# Patient Record
Sex: Male | Born: 1955 | Race: White | Hispanic: No | Marital: Married | Smoking: Former smoker
Health system: Southern US, Community
[De-identification: ages and names within clinical notes are randomized; demographics above are authoritative.]

## PROBLEM LIST (undated history)

## (undated) DIAGNOSIS — M1990 Unspecified osteoarthritis, unspecified site: Secondary | ICD-10-CM

## (undated) DIAGNOSIS — K219 Gastro-esophageal reflux disease without esophagitis: Secondary | ICD-10-CM

## (undated) DIAGNOSIS — S46219A Strain of muscle, fascia and tendon of other parts of biceps, unspecified arm, initial encounter: Secondary | ICD-10-CM

## (undated) DIAGNOSIS — J069 Acute upper respiratory infection, unspecified: Secondary | ICD-10-CM

## (undated) DIAGNOSIS — M75122 Complete rotator cuff tear or rupture of left shoulder, not specified as traumatic: Secondary | ICD-10-CM

## (undated) DIAGNOSIS — Z8719 Personal history of other diseases of the digestive system: Secondary | ICD-10-CM

## (undated) DIAGNOSIS — M75102 Unspecified rotator cuff tear or rupture of left shoulder, not specified as traumatic: Secondary | ICD-10-CM

## (undated) DIAGNOSIS — F17200 Nicotine dependence, unspecified, uncomplicated: Secondary | ICD-10-CM

## (undated) DIAGNOSIS — J449 Chronic obstructive pulmonary disease, unspecified: Secondary | ICD-10-CM

## (undated) DIAGNOSIS — R0602 Shortness of breath: Secondary | ICD-10-CM

## (undated) DIAGNOSIS — M549 Dorsalgia, unspecified: Secondary | ICD-10-CM

## (undated) HISTORY — PX: ROTATOR CUFF REPAIR: SHX139

## (undated) HISTORY — PX: TOTAL SHOULDER ARTHROPLASTY: SHX126

## (undated) HISTORY — PX: HERNIA REPAIR: SHX51

---

## 1998-03-13 ENCOUNTER — Encounter: Payer: Self-pay | Admitting: Emergency Medicine

## 1998-03-13 ENCOUNTER — Emergency Department (HOSPITAL_COMMUNITY): Admission: EM | Admit: 1998-03-13 | Discharge: 1998-03-13 | Payer: Self-pay | Admitting: Emergency Medicine

## 2006-06-20 HISTORY — PX: TOTAL SHOULDER ARTHROPLASTY: SHX126

## 2006-06-20 HISTORY — PX: ROTATOR CUFF REPAIR: SHX139

## 2006-09-21 ENCOUNTER — Ambulatory Visit (HOSPITAL_BASED_OUTPATIENT_CLINIC_OR_DEPARTMENT_OTHER): Admission: RE | Admit: 2006-09-21 | Discharge: 2006-09-22 | Payer: Self-pay | Admitting: Orthopedic Surgery

## 2007-03-13 ENCOUNTER — Inpatient Hospital Stay (HOSPITAL_COMMUNITY): Admission: RE | Admit: 2007-03-13 | Discharge: 2007-03-15 | Payer: Self-pay | Admitting: Orthopedic Surgery

## 2010-11-02 NOTE — Op Note (Signed)
Daniel Monroe, Daniel Monroe                ACCOUNT NO.:  [id number]   MEDICAL RECORD NO.:  [id number]          PATIENT TYPE:  INP   LOCATION:  5015                         FACILITY:  [facility]   PHYSICIAN:  Katy Fitch. Sypher, M.D. DATE OF BIRTH:  Apr 29, 1956   DATE OF PROCEDURE:  03/13/2007  DATE OF DISCHARGE:                               OPERATIVE REPORT   PREOPERATIVE DIAGNOSIS:  Cuff tear arthropathy, right shoulder, due to  an approximately 42+-year-old rotator cuff avulsion involving the entire  supraspinatus, infraspinatus, and teres minor tendons with a  degenerative subscapularis tendon and evidence of chronic greater  tuberosity subacromial impingement due to proximal migration of humeral  head.   POSTOPERATIVE DIAGNOSIS:  Cuff tear arthropathy, right shoulder, due to  an approximately 38+-year-old rotator cuff avulsion involving the entire  supraspinatus, infraspinatus, and teres minor tendons with a  degenerative subscapularis tendon and evidence of chronic greater  tuberosity subacromial impingement due to proximal migration of humeral  head, with confirmation of extensive tendinopathy of subscapularis  tendon and complete loss of hyaline articular cartilage on the dorsal  surface of the humeral head with development of a marginal osteophyte  and grade 3 chondromalacia of articular surface of central humeral head.   OPERATION:  1. Right shoulder hemiarthroplasty utilizing a Bi[employer] Bio-Modular 10 x      150-mm humeral stem component and a 54 x 27-mm head and neck      component.  2. Reconstruction of rotator cuff and bicipital tendon interval with      superior and lateral transfer of subscapularis for coverage of the      long head of the biceps.   SURGEON:  Katy Fitch. Sypher, M.D.   ASSISTANT:  Annye Rusk, P.A.-C.   ANESTHESIA:  General endotracheal supplemented by a right interscalene  block.   SUPERVISING ANESTHESIOLOGIST:  Sheldon Silvan, M.D.   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   He presented at that time with a history of severe right shoulder pain  with weakness of abduction and external rotation and moderately severe  left shoulder pain.   Clinical examination revealed evidence of AC arthropathy and weakness of  abduction and external rotation on the right and moderate weakness of  abduction and external rotation on the left.  Plain films documented  extensive AC arthropathy bilaterally and remodeling of the acromion due  to a high-riding humeral head on the right.  Bilateral shoulder MRIs  were obtained which documented a chronic retracted rotator cuff avulsion  of the supraspinatus, infraspinatus, teres minor tendons on the right  with a degenerative subscapularis that was still attached to the greater  tuberosity.   There was evidence of early glenohumeral arthritis with a marginal  osteophyte forming and irregularity at the greater tuberosity due to  subacromial impingement.   On the left, there was a rotator cuff tear with moderate retraction and  unfavorable AC anatomy.   After lengthy informed consent with Daniel Monroe, we elected to proceed  with repair of the left rotator  cuff as our initial procedure to allow  him to gain maximum function prior to attempted reconstruction of the  right shoulder.   Preoperatively, he was advised that based on the MRI appearance of his  rotator cuff muscles that were avulsed with fatty substitution and loss  of volume, he did not have what appeared to be a repairable rotator cuff  tear predicament on the right.   Preoperatively, he noted pain any time he would sleep on the right side,  pain with elevation of his arm above the head, weakness of abduction,  and pain with everyday use of the arm.   We advised him that we could salvage his right shoulder function with a  hemiarthroplasty, considering either a standard or  extended surface area  head implant depending on his bony anatomy.   He understood that the goal of this procedure was pain relief.  We could  not provide him any assurances as to increased strength or  improved  range of motion with this intervention.   After lengthy informed consent and 6 months of consideration, [name] now presents for reconstruction of the right shoulder,  anticipating a right shoulder hemiarthroplasty for cuff tear arthropathy  and reconstruction of the rotator cuff as our findings allow.  Preoperatively, he was advised of the potential risks and benefits of  surgery.  He understands the rehabilitation experience after his surgery  on the left side.   Questions were invited and answered in the holding area.   PROCEDURE:  Daniel Monroe is brought to the operating room and placed in  the supine position on the operating table.   Dr. Ivin Booty had provided an anesthesia consult in the holding area during  which Daniel Monroe agreed to a right interscalene block preoperatively.   This was placed by Dr. Ivin Booty without complication.   He was subsequently transferred to room #1, placed in the supine  position on the operating table, and under Dr. Ivin Booty' direct  supervision, general endotracheal anesthesia induced.  He was carefully  positioned in the beach chair position with the aid of torso and head  holder designed for shoulder arthroscopy.   1 gram of Ancef had been administered in the holding area as an IV  prophylactic antibiotic followed by prep of the right arm with DuraPrep  including the forequarter.  Impervious arthroscopy drapes were applied.   The procedure commenced with examination of the shoulder under  anesthesia.  Daniel Monroe had combined elevation to 180 degrees, external  rotation to 90, internal rotation 80.  He had obvious superior inferior  instability due to the complete avulsion of his cuff.  He also had some  posterior instability due to  the avulsion of his posterior cuff and  posterior capsular laxity.   We subsequently proceeded to plan an incision extending from the  coracoid to the axillary fold.   The incision was taken sharply followed by identification of the  cephalic vein in the deltopectoral interval.  This was mobilized  medially with suture ligation of feeding vessels to the deltoid.  The  clavipectoral fascia was subsequently identified and released followed  by identification of the short head of the biceps and tenotomy of the  lateral half of the short head tendon to allow retraction of the short  head and exposure of the subscapularis.  The anatomy of the  subscapularis was identified.  There was a degenerative knot superiorly  just anterior and inferior to the bicipital groove.  There was only  bursa covering the humeral head that was a millimeter or two thickness.  The entire supraspinatus, infraspinatus, and teres minor tendons had  avulsed and retracted medially.  There was a considerable amount of  degenerative tendon material in the subacromial space.   The biceps tendon was inspected and found to have about a 20%  degenerative tear at the entrance to the bicipital groove but was  otherwise intact.   Given the circumstance of an intact long head of biceps, it is likely  that he would have a better outcome with a standard hemiarthroplasty  rather than the extended surface area hemiarthroplasty so as to lessen  possibility of ruptured long head of the biceps following implantation.   The pectoralis major tendon was released in his upper 15 mm, and the  shoulder was externally rotated.  The subscapularis was gently elevated  with a 15 scalpel blade off the lesser tuberosity.  The inferior capsule  was identified, the axillary nerve palpated, and a joker placed within  the joint capsule.  With extreme care, a small osteotome was used to  elevate the anterior capsule off of the neck of the humerus,  exposing  the inferior aspect of the humeral head.  There was a sizable marginal  osteophyte forming which made identification of the capsular margins  somewhat challenging.   The marginal osteophyte was meticulous removed with a large rongeur  followed by placement of a Crego elevator to protect the axillary nerve.  The bursa was mobilized superiorly followed by placement of a second  curved Crego retractor posteriorly.  The humeral head was then  delivered.  A cutting guide was used to select the proper angle to  resect the humeral head.  The arm was placed in 35 degrees of external  rotation, creating a retroverted cut of 35 degrees.  An oscillating saw  was used to amputate the humeral head followed by use of a rongeur to  complete the resection.   We delivered the humeral head and sized the head.  Initially, a 48 x 24-  mm head appeared satisfactory; however, given the concern about possible  protection of the long head of the biceps as well as the possible use of  an extended surface area head, I ultimately elected to oversize the  heads so that we would have adequate articulation of metal against the  acromion.   Ultimately, the shaft of the humerus was delivered and prepared with  reamers up to an internal diameter of 11 mm.  We were experiencing  considerable chatter on the isthmus at 11 mm of reaming.  Therefore, we  ultimately felt that a 10-mm porous coated stem would be appropriate  choice.   A broach was then used to cut the proximal humerus and trial humeral  heads applied.  The 11-mm broach was simply too large in the proximal  humerus to allow proper seating; therefore, we returned a 10-mm broach  and trial stem.  Various heads were tried, ultimately deciding that the  54 x 27-mm standard hemiarthroplasty head gave the greatest coverage and  most congruous fit with the greater tuberosity.  The extended surface  area would have left the flange over which the long  head of the biceps  would ride, leading likely to a rupture.   In my judgment, protecting the long head of the biceps was an important  strategy.   The intermedullary canal was prepped with irrigation followed by  placement of  cancellus bone graft from the humeral head into the shaft  intermaxillary canal to increase fixation of the porous coated stem.  This was tamped into position with the cutting broach followed by  placement of the 10-mm stem with no-touch technique and tamping it into  place.   Nonabsorbable Dacron sutures were placed through drill holes for  reconstruction of the subscapularis.   In order to protect the long head of the biceps and to encourage an  inferior translation of the humeral head during abduction, I  advanced  the subscapularis approximately 2 cm superior and laterally.  This was  advanced while maintaining the arm in a position of slight internal  rotation with a series of three mattress sutures, repairing the capsule  and subscapularis anatomically, ultimately covering the long head of the  biceps at the bicipital groove.   The remnants of bursa posteriorly were then repaired to this transferred  subscapularis with a running suture of 0 Vicryl.   A very smooth repair was achieved that was congruous with the native  greater tuberosity.   The shoulder was ranged with forward flexion to 170 degrees of elevation  without obvious impingement and was able to externally rotate at least  45 degrees following subscapularis repair.   Hemostasis was achieved followed by thorough irrigation of the wound.  The short head of biceps tenotomy was repaired with grasping suture of  #2 FiberWire followed by placement of a Hemovac drain in the  deltopectoral interval.  The skin was repaired with subdermal sutures of  0 Vicryl, intradermal 3-0 Prolene, and Steri-Strips.  There were no  apparent complications.   Daniel Monroe tolerated surgery and anesthesia well.   His pulse was  approximately 60-70 throughout the procedure.  Our estimated blood loss  was 300 mL.  There were no apparent complications.      Katy Fitch Sypher, M.D.  Electronically Signed     RVS/MEDQ  D:  03/13/2007  T:  03/13/2007  Job:  [id number]

## 2010-11-05 NOTE — Op Note (Signed)
NAMETHEODOROS, Daniel Monroe                ACCOUNT NO.:  [id number]   MEDICAL RECORD NO.:  [id number]          PATIENT TYPE:  AMB   LOCATION:  DSC                          FACILITY:  [facility]   PHYSICIAN:  Katy Fitch. Sypher, M.D. DATE OF BIRTH:  27-Sep-1955   DATE OF PROCEDURE:  09/21/2006  DATE OF DISCHARGE:                               OPERATIVE REPORT   PREOPERATIVE DIAGNOSIS:  Chronic left shoulder pain due to  acromioclavicular joint arthropathy and clinical and MRI-proven chronic  retracted rotator cuff tear involving the supraspinatus and  infraspinatus tendons, with rupture of the long head of the biceps.   POSTOPERATIVE DIAGNOSES:  1. Chronic adhesive capsulitis.  2. Severely degenerative rotator cuff retracted tear.  3. Rupture of long head of biceps.  4. 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   OPERATION:  1. Examination of the left shoulder under anesthesia.  2. Diagnostic arthroscopy of the left glenohumeral joint, followed by      arthroscopic debridement of labral tear and extensive adhesive      capsulitis of granulation tissues with documentation of rupture of      long head of biceps.  3. Subacromial decompression with release of coracoacromial ligament,      acromioplasty, and removal of medial osteophyte at      acromioclavicular joint.  4. Open resection of distal clavicle.  5. Reconstruction of chronic degenerative rotator cuff tear with      lateral advancement with-through bone McLaughlin suture and      biocorkscrew anchors creating a medial footprint.   OPERATING SURGEON:  Katy Fitch. Sypher, M.D.   ASSISTANT:  Molly Maduro Dasnoit PA-C.   ANESTHESIA:  General endotracheal supplemented by a left interscalene  block, supervising anesthesiologist is Dr. Sampson Goon.   INDICATIONS:  Daniel Monroe is a 55 year old right-hand dominant  gentleman referred through the courtesy of a common friend and patient,  Daniel Monroe, for evaluation and  management of bilateral severe  shoulder pain and weakness.  Daniel Monroe was evaluated as an outpatient  and found to have evidence of AC arthropathy bilaterally, a high-riding  humeral head on the right, evidence of a chronic rotator cuff tear, and  impingement signs on the left.  He was sent for bilateral shoulder MRIs,  which documented a very large retracted, probably irreparable, chronic  rotator cuff tear dating more than 55 years of age on the right and on  the left a chronic retracted degenerative rotator cuff tear involving  the supraspinatus and infraspinatus tendons that appeared to be  amendable to possible reconstruction.   Preoperatively, I advised him to consider proceeding with prompt repair  of the left side in an effort to salvage the left shoulder.  Given the  loss of volume in the supraspinatus and the degree of retraction of his  supraspinatus on the right, in my judgment it is unlikely that he will  be able to experience a successful rotator cuff reconstruction although  a partial repair may be possible.   We advised him to proceed with reconstruction of his left rotator cuff  at this time  as salvage appeared possible, and we recommended that he  consider attempted salvage versus hemiarthroplasty of the right shoulder  with a primary indication of pain relief, secondary indication recovery  of motion.   After a lengthy informed consent, Daniel Monroe was brought to the  operating room at this time.  Preoperatively he was interviewed by Dr.  Sampson Goon for an anesthesia consultation.  Dr. Sampson Goon recommended  general endotracheal anesthesia supplemented by interscalene block for  perioperative comfort.   After informed consent, Dr. Sampson Goon placed a left interscalene block  with excellent anesthesia of the left arm and forequarter.   Daniel Monroe was then transferred to room 8. placed in the supine  position on the operating table. and under Dr. Jarrett Ables  direct  supervision general endotracheal anesthesia induced.   Daniel Monroe was placed in the beach-chair position and examination of  the left shoulder under anesthesia revealed elevation 180, external  rotation 90, internal rotation of 90.  He did not show clinical signs of  adhesive capsulitis, although there was considerable crepitation beneath  the acromion.   After routine DuraPrep of the left arm and forequarter, impervious  arthroscopy drapes were applied.  The scope was then introduced through  a standard posterior viewing portal through the soft spot with a blunt  trocar, followed by diagnostic arthroscopy.   Abundant adhesive capsulitis granulation tissue was present within the  joint as well as extensive degeneration of the labrum and a complete  rupture of long head of the biceps.   The pathology was documented with the digital camera, followed by  introduction of a shaver through an anterior portal, followed by  extensive debridement of the deep surface fragments of the rotator cuff  degenerative tear, debridement of labrum and debridement of the  capsulitis granulations.   We then removed the scope and proceeded directly to open reconstruction  of the rotator cuff.   A 7 cm incision was fashioned from the distal clavicle across the  anterior acromion.  Full-thickness flaps were elevated off the acromion,  including the coracoacromial ligament.  A significant portion of the  coracoacromial ligament had ossified and was meticulously dissected out  of the soft tissues.  The acromion had a very significant type 3  anterior lip, which was leveled to a type 1 morphology with an  oscillating saw and hand rasp.  A large osteophyte of the medial AC  joint was removed with rongeur dissection.  The distal 15 mm of clavicle  was removed with an oscillating saw, followed by smoothing of the inferior surface of the distal clavicle with a rongeur.  The bursa was  extremely  hypertrophic and quite scarred.  A thorough bursectomy was  accomplished visualizing a retracted degenerative cuff tear.  There was  a large triangular defect extending back just short of the glenoid.  The  entire supraspinatus and infraspinatus were undermined with shreds of  tendon hanging within the joint.  A proximal longitudinal incision was  fashioned and flaps of the infraspinatus and supraspinatus were raised.  The deep surface remnants of the degenerative tendon were debrided,  followed by decorticating the greater tuberosity over a distance of  approximately 5 cm from anterior to posterior with a power bur.   A gathering baseball stitch of #2 FiberWire was used to converge the  tendon, advance it laterally, and bone tunnels were created to replace  the supraspinatus and infraspinatus anatomically.  A single biocorkscrew  anchor was placed at the joint-hyaline articular  cartilage margin at the  center of the infraspinatus and the center of the supraspinatus to  create a good medial footprint.  After the [facility] suture was  tensioned appropriately, the mattress sutures x4 were inset creating an  excellent medial footprint of the repair.   The cuff repair was then finished with a figure-of-eight suture at the  corner of 0 Vicryl.   The subdeltoid and subacromial space were thoroughly lavaged with the  arthroscopic cannula using the pump for power irrigation, followed by  anatomic repair of the anterior third of the deltoid, initially closing  the dead space by repair to the trapezium at the site of the distal  clavicle resection, followed by repair to the anterior acromion with  periosteal and capsular sutures creating a very strong flap to flap  repair.  The lateral deltoid muscle split was repaired with simple  suture of 0 Vicryl.   The skin was then repaired with subdermal suture of 2-0 Vicryl and  intradermal 3-0 Prolene with Steri-Strips.   A virtually anatomic repair  was achieved with about an 80% thickness of  the rotator cuff.  This should allow Daniel Monroe to recover improved  strength and comfort following decompression.   Daniel Monroe was placed in a compressive dressing and a sling and  transferred to the recovery room with stable signs.   He will be admitted to the recovery care center for observation of his  vital signs and antibiotics in the form of Ancef 1 g IV q.8h. x3 doses  as well as p.o. and IV Dilaudid and IV and PCA morphine as an analgesic  regimen.      Katy Fitch Sypher, M.D.  Electronically Signed     RVS/MEDQ  D:  09/21/2006  T:  09/21/2006  Job:  [id number]

## 2011-03-31 LAB — BASIC METABOLIC PANEL
BUN: 6
BUN: 8
CO2: 28
CO2: 29
Calcium: 7.7 — ABNORMAL LOW
Calcium: 9
Chloride: 101
Chloride: 98
Creatinine, Ser: 0.7
Creatinine, Ser: 0.75
GFR calc Af Amer: >60
GFR calc Af Amer: >60
GFR calc non Af Amer: >60
GFR calc non Af Amer: >60
Glucose, Bld: 116 — ABNORMAL HIGH
Glucose, Bld: 88
Potassium: 3.7
Potassium: 3.8
Sodium: 132 — ABNORMAL LOW
Sodium: 137

## 2011-03-31 LAB — CBC
HCT: 35.9 — ABNORMAL LOW
HCT: 43.5
Hemoglobin: 12.3 — ABNORMAL LOW
Hemoglobin: 15.3
MCHC: 34.3
MCHC: 35.1
MCV: 93.1
MCV: 95.7
Platelets: 348
Platelets: 443 — ABNORMAL HIGH
RBC: 3.75 — ABNORMAL LOW
RBC: 4.67
RDW: 13.6
RDW: 13.6
WBC: 11.2 — ABNORMAL HIGH
WBC: 9.4

## 2012-11-20 ENCOUNTER — Other Ambulatory Visit: Payer: Self-pay | Admitting: Neurosurgery

## 2012-11-20 DIAGNOSIS — M5416 Radiculopathy, lumbar region: Secondary | ICD-10-CM

## 2012-11-27 ENCOUNTER — Ambulatory Visit
Admission: RE | Admit: 2012-11-27 | Discharge: 2012-11-27 | Disposition: A | Discharge status: Home / Self Care | Source: Ambulatory Visit | Attending: Neurosurgery | Admitting: Neurosurgery

## 2012-11-27 DIAGNOSIS — M5416 Radiculopathy, lumbar region: Secondary | ICD-10-CM

## 2012-12-18 ENCOUNTER — Other Ambulatory Visit: Payer: Self-pay | Admitting: Neurosurgery

## 2013-01-01 ENCOUNTER — Encounter (HOSPITAL_COMMUNITY): Payer: Self-pay | Admitting: Pharmacy Technician

## 2013-01-02 NOTE — Pre-Procedure Instructions (Signed)
Daniel Monroe  01/02/2013   Your procedure is scheduled on: Monday, January 07, 2013  Report to [facility] Stay [facility] at 8:45 AM.  Call this number if you have problems the morning of surgery: [phone]   Remember:   Do not eat food or drink liquids after midnight.   Take these medicines the morning of surgery with A SIP OF WATER: gabapentin (NEURONTIN) 300 MG capsule, Tetrahydrozoline HCl (VISINE) if needed: HYDROmorphone (DILAUDID) 4 MG tablet for pain if needed Stop taking Aspirin and herbal medication 7 days prior to surgerys . Do not take any NSAIDs ie: Ibuprofen, Advil, Naproxen or any medication containing Aspirin, (etodolac (LODINE) 400 MG tablet)     Do not wear jewelry, make-up or nail polish.  Do not wear lotions, powders, or perfumes. You may wear deodorant.  Men may shave face and neck.  Do not bring valuables to the hospital.  [facility] is not responsible  for any belongings or valuables.  Contacts, dentures or bridgework may not be worn into surgery.  Leave suitcase in the car. After surgery it may be brought to your room.  For patients admitted to the hospital, checkout time is 11:00 AM the day of discharge.   Patients discharged the day of surgery will not be allowed to drive home.  Name and phone number of your driver:   Special Instructions: Shower using CHG 2 nights before surgery and the night before surgery.  If you shower the day of surgery use CHG.  Use special wash - you have one bottle of CHG for all showers.  You should use approximately 1/3 of the bottle for each shower.   Please read over the following fact sheets that you were given: Pain Booklet, Coughing and Deep Breathing, MRSA Information and Surgical Site Infection Prevention

## 2013-01-03 ENCOUNTER — Encounter (HOSPITAL_COMMUNITY): Payer: Self-pay

## 2013-01-03 ENCOUNTER — Encounter (HOSPITAL_COMMUNITY)
Admission: RE | Admit: 2013-01-03 | Discharge: 2013-01-03 | Disposition: A | Discharge status: Home / Self Care | Source: Ambulatory Visit | Attending: Neurosurgery | Admitting: Neurosurgery

## 2013-01-03 DIAGNOSIS — Z01812 Encounter for preprocedural laboratory examination: Secondary | ICD-10-CM | POA: Insufficient documentation

## 2013-01-03 DIAGNOSIS — M79609 Pain in unspecified limb: Secondary | ICD-10-CM | POA: Insufficient documentation

## 2013-01-03 DIAGNOSIS — Z01818 Encounter for other preprocedural examination: Secondary | ICD-10-CM | POA: Insufficient documentation

## 2013-01-03 DIAGNOSIS — M5106 Intervertebral disc disorders with myelopathy, lumbar region: Secondary | ICD-10-CM | POA: Insufficient documentation

## 2013-01-03 DIAGNOSIS — M545 Low back pain, unspecified: Secondary | ICD-10-CM | POA: Insufficient documentation

## 2013-01-03 HISTORY — DX: Dorsalgia, unspecified: M54.9

## 2013-01-03 HISTORY — DX: Shortness of breath: R06.02

## 2013-01-03 HISTORY — DX: Nicotine dependence, unspecified, uncomplicated: F17.200

## 2013-01-03 LAB — CBC
MCV: 92.6 fL (ref 78.0–100.0)
Platelets: 379 10*3/uL (ref 150–400)
RBC: 4.06 MIL/uL — ABNORMAL LOW (ref 4.22–5.81)
RDW: 13.4 % (ref 11.5–15.5)
WBC: 5.4 10*3/uL (ref 4.0–10.5)

## 2013-01-03 LAB — BASIC METABOLIC PANEL
CO2: 27 mEq/L (ref 19–32)
Calcium: 8.9 mg/dL (ref 8.4–10.5)
Creatinine, Ser: 0.6 mg/dL (ref 0.50–1.35)
GFR calc Af Amer: >90 mL/min (ref >90)
Sodium: 134 mEq/L — ABNORMAL LOW (ref 135–145)

## 2013-01-03 LAB — SURGICAL PCR SCREEN
MRSA, PCR: NEGATIVE
Staphylococcus aureus: NEGATIVE

## 2013-01-03 NOTE — Progress Notes (Signed)
Patient denied having any cardiac or pulmonary issues. PCP is Dr. Molinda Bailiff in [place].

## 2013-01-03 NOTE — Progress Notes (Signed)
Nurse called Erie Noe at Dr. Lovell Sheehan office and informed her that orders were under signed and held and needed to be signed. Erie Noe informed Nurse that Dr. Lovell Sheehan was on vacation. Consent will be signed DOS.

## 2013-01-06 MED ORDER — CEFAZOLIN SODIUM-DEXTROSE 2-3 GM-% IV SOLR
2.0000 g | INTRAVENOUS | Status: AC
  Administered 2013-01-07: 2 g via INTRAVENOUS
  Filled 2013-01-06 (×2): qty 50

## 2013-01-07 ENCOUNTER — Inpatient Hospital Stay (HOSPITAL_COMMUNITY)
Admission: RE | Admit: 2013-01-07 | Discharge: 2013-01-08 | DRG: 490 | Disposition: A | Discharge status: Home / Self Care | Source: Ambulatory Visit | Attending: Neurosurgery | Admitting: Neurosurgery

## 2013-01-07 ENCOUNTER — Ambulatory Visit (HOSPITAL_COMMUNITY)

## 2013-01-07 ENCOUNTER — Ambulatory Visit (HOSPITAL_COMMUNITY): Admitting: Anesthesiology

## 2013-01-07 ENCOUNTER — Encounter (HOSPITAL_COMMUNITY): Payer: Self-pay | Admitting: Surgery

## 2013-01-07 ENCOUNTER — Encounter (HOSPITAL_COMMUNITY)
Admission: RE | Disposition: A | Discharge status: Home / Self Care | Payer: Self-pay | Source: Ambulatory Visit | Attending: Neurosurgery

## 2013-01-07 ENCOUNTER — Encounter (HOSPITAL_COMMUNITY): Payer: Self-pay | Admitting: Anesthesiology

## 2013-01-07 DIAGNOSIS — Z79899 Other long term (current) drug therapy: Secondary | ICD-10-CM

## 2013-01-07 DIAGNOSIS — M5126 Other intervertebral disc displacement, lumbar region: Secondary | ICD-10-CM

## 2013-01-07 DIAGNOSIS — F17200 Nicotine dependence, unspecified, uncomplicated: Secondary | ICD-10-CM | POA: Diagnosis present

## 2013-01-07 DIAGNOSIS — Z96619 Presence of unspecified artificial shoulder joint: Secondary | ICD-10-CM

## 2013-01-07 DIAGNOSIS — G9741 Accidental puncture or laceration of dura during a procedure: Secondary | ICD-10-CM | POA: Diagnosis not present

## 2013-01-07 DIAGNOSIS — IMO0002 Reserved for concepts with insufficient information to code with codable children: Secondary | ICD-10-CM | POA: Diagnosis not present

## 2013-01-07 HISTORY — PX: LUMBAR LAMINECTOMY/DECOMPRESSION MICRODISCECTOMY: SHX5026

## 2013-01-07 SURGERY — LUMBAR LAMINECTOMY/DECOMPRESSION MICRODISCECTOMY 1 LEVEL
Anesthesia: General | Site: Back | Laterality: Right | Wound class: Clean

## 2013-01-07 MED ORDER — ROCURONIUM BROMIDE 100 MG/10ML IV SOLN
INTRAVENOUS | Status: DC | PRN
  Administered 2013-01-07: 10 mg via INTRAVENOUS
  Administered 2013-01-07: 50 mg via INTRAVENOUS

## 2013-01-07 MED ORDER — MEPERIDINE HCL 25 MG/ML IJ SOLN: 6.2500 mg | INTRAMUSCULAR | Status: DC | PRN

## 2013-01-07 MED ORDER — MIDAZOLAM HCL 2 MG/2ML IJ SOLN: 0.5000 mg | Freq: Once | INTRAMUSCULAR | Status: DC | PRN

## 2013-01-07 MED ORDER — OXYCODONE HCL 5 MG/5ML PO SOLN: 5.0000 mg | Freq: Once | ORAL | Status: DC | PRN

## 2013-01-07 MED ORDER — SODIUM CHLORIDE 0.9 % IR SOLN
Status: DC | PRN
  Administered 2013-01-07: 14:00:00

## 2013-01-07 MED ORDER — PHENOL 1.4 % MT LIQD: 1.0000 | OROMUCOSAL | Status: DC | PRN

## 2013-01-07 MED ORDER — BACITRACIN ZINC 500 UNIT/GM EX OINT
TOPICAL_OINTMENT | CUTANEOUS | Status: DC | PRN
  Administered 2013-01-07: 1 via TOPICAL

## 2013-01-07 MED ORDER — ONDANSETRON HCL 4 MG/2ML IJ SOLN
INTRAMUSCULAR | Status: DC | PRN
  Administered 2013-01-07: 4 mg via INTRAVENOUS

## 2013-01-07 MED ORDER — GABAPENTIN 300 MG PO CAPS
300.0000 mg | ORAL_CAPSULE | Freq: Three times a day (TID) | ORAL | Status: DC
  Administered 2013-01-07 – 2013-01-08 (×2): 300 mg via ORAL
  Filled 2013-01-07 (×4): qty 1

## 2013-01-07 MED ORDER — PHENYLEPHRINE HCL 10 MG/ML IJ SOLN
INTRAMUSCULAR | Status: DC | PRN
  Administered 2013-01-07 (×8): 80 ug via INTRAVENOUS

## 2013-01-07 MED ORDER — ONDANSETRON HCL 4 MG/2ML IJ SOLN: 4.0000 mg | INTRAMUSCULAR | Status: DC | PRN

## 2013-01-07 MED ORDER — MENTHOL 3 MG MT LOZG: 1.0000 | LOZENGE | OROMUCOSAL | Status: DC | PRN

## 2013-01-07 MED ORDER — HEMOSTATIC AGENTS (NO CHARGE) OPTIME
TOPICAL | Status: DC | PRN
  Administered 2013-01-07: 1 via TOPICAL

## 2013-01-07 MED ORDER — DEXAMETHASONE SODIUM PHOSPHATE 4 MG/ML IJ SOLN
INTRAMUSCULAR | Status: DC | PRN
  Administered 2013-01-07: 8 mg via INTRAVENOUS

## 2013-01-07 MED ORDER — CEFAZOLIN SODIUM-DEXTROSE 2-3 GM-% IV SOLR
2.0000 g | Freq: Three times a day (TID) | INTRAVENOUS | Status: AC
  Administered 2013-01-07 – 2013-01-08 (×2): 2 g via INTRAVENOUS
  Filled 2013-01-07 (×2): qty 50

## 2013-01-07 MED ORDER — LACTATED RINGERS IV SOLN
INTRAVENOUS | Status: DC
  Administered 2013-01-07 (×2): via INTRAVENOUS

## 2013-01-07 MED ORDER — BACITRACIN 50000 UNITS IM SOLR
INTRAMUSCULAR | Status: AC
  Filled 2013-01-07: qty 1

## 2013-01-07 MED ORDER — LIDOCAINE HCL (CARDIAC) 20 MG/ML IV SOLN
INTRAVENOUS | Status: DC | PRN
  Administered 2013-01-07: 20 mg via INTRAVENOUS

## 2013-01-07 MED ORDER — HYDROMORPHONE HCL 2 MG PO TABS
4.0000 mg | ORAL_TABLET | ORAL | Status: DC | PRN
  Administered 2013-01-08: 4 mg via ORAL
  Filled 2013-01-07: qty 2

## 2013-01-07 MED ORDER — ETODOLAC 400 MG PO TABS
400.0000 mg | ORAL_TABLET | Freq: Two times a day (BID) | ORAL | Status: DC | PRN
  Filled 2013-01-07: qty 1

## 2013-01-07 MED ORDER — PROPOFOL 10 MG/ML IV BOLUS
INTRAVENOUS | Status: DC | PRN
  Administered 2013-01-07: 110 mg via INTRAVENOUS

## 2013-01-07 MED ORDER — HYDROMORPHONE HCL PF 1 MG/ML IJ SOLN
0.2500 mg | INTRAMUSCULAR | Status: DC | PRN
  Administered 2013-01-07 (×2): 0.5 mg via INTRAVENOUS

## 2013-01-07 MED ORDER — THROMBIN 5000 UNITS EX SOLR
CUTANEOUS | Status: DC | PRN
  Administered 2013-01-07 (×2): 5000 [IU] via TOPICAL

## 2013-01-07 MED ORDER — MIDAZOLAM HCL 5 MG/5ML IJ SOLN
INTRAMUSCULAR | Status: DC | PRN
  Administered 2013-01-07: 1 mg via INTRAVENOUS

## 2013-01-07 MED ORDER — HYDROCODONE-ACETAMINOPHEN 5-325 MG PO TABS
1.0000 | ORAL_TABLET | ORAL | Status: DC | PRN
Start: 2013-01-07 — End: 2013-01-08
  Administered 2013-01-07 – 2013-01-08 (×2): 2 via ORAL
  Filled 2013-01-07 (×2): qty 2

## 2013-01-07 MED ORDER — LACTATED RINGERS IV SOLN
INTRAVENOUS | Status: DC | PRN
  Administered 2013-01-07 (×2): via INTRAVENOUS

## 2013-01-07 MED ORDER — SODIUM CHLORIDE 0.9 % IV SOLN
INTRAVENOUS | Status: AC
  Filled 2013-01-07: qty 500

## 2013-01-07 MED ORDER — DOCUSATE SODIUM 100 MG PO CAPS
100.0000 mg | ORAL_CAPSULE | Freq: Two times a day (BID) | ORAL | Status: DC
  Administered 2013-01-07: 100 mg via ORAL
  Filled 2013-01-07: qty 1

## 2013-01-07 MED ORDER — ACETAMINOPHEN 325 MG PO TABS: 650.0000 mg | ORAL_TABLET | ORAL | Status: DC | PRN

## 2013-01-07 MED ORDER — ALUM & MAG HYDROXIDE-SIMETH 200-200-20 MG/5ML PO SUSP: 30.0000 mL | Freq: Four times a day (QID) | ORAL | Status: DC | PRN

## 2013-01-07 MED ORDER — NEOSTIGMINE METHYLSULFATE 1 MG/ML IJ SOLN
INTRAMUSCULAR | Status: DC | PRN
  Administered 2013-01-07: 2.5 mg via INTRAVENOUS

## 2013-01-07 MED ORDER — BUPIVACAINE-EPINEPHRINE PF 0.5-1:200000 % IJ SOLN
INTRAMUSCULAR | Status: DC | PRN
  Administered 2013-01-07: 10 mL

## 2013-01-07 MED ORDER — DIAZEPAM 5 MG PO TABS: 5.0000 mg | ORAL_TABLET | Freq: Four times a day (QID) | ORAL | Status: DC | PRN

## 2013-01-07 MED ORDER — LIDOCAINE HCL 4 % MT SOLN
OROMUCOSAL | Status: DC | PRN
  Administered 2013-01-07: 4 mL via TOPICAL

## 2013-01-07 MED ORDER — MORPHINE SULFATE 2 MG/ML IJ SOLN
1.0000 mg | INTRAMUSCULAR | Status: DC | PRN
  Administered 2013-01-08: 2 mg via INTRAVENOUS
  Filled 2013-01-07: qty 1

## 2013-01-07 MED ORDER — FENTANYL CITRATE 0.05 MG/ML IJ SOLN
INTRAMUSCULAR | Status: DC | PRN
  Administered 2013-01-07: 250 ug via INTRAVENOUS

## 2013-01-07 MED ORDER — ZOLPIDEM TARTRATE 5 MG PO TABS: 5.0000 mg | ORAL_TABLET | Freq: Every evening | ORAL | Status: DC | PRN

## 2013-01-07 MED ORDER — LACTATED RINGERS IV SOLN
INTRAVENOUS | Status: DC
  Administered 2013-01-07: 10:00:00 via INTRAVENOUS

## 2013-01-07 MED ORDER — PROMETHAZINE HCL 25 MG/ML IJ SOLN: 6.2500 mg | INTRAMUSCULAR | Status: DC | PRN

## 2013-01-07 MED ORDER — GLYCOPYRROLATE 0.2 MG/ML IJ SOLN
INTRAMUSCULAR | Status: DC | PRN
  Administered 2013-01-07: 0.4 mg via INTRAVENOUS

## 2013-01-07 MED ORDER — ACETAMINOPHEN 650 MG RE SUPP: 650.0000 mg | RECTAL | Status: DC | PRN

## 2013-01-07 MED ORDER — OXYCODONE HCL 5 MG PO TABS: 5.0000 mg | ORAL_TABLET | Freq: Once | ORAL | Status: DC | PRN

## 2013-01-07 SURGICAL SUPPLY — 66 items
ADH SKN CLS LQ APL DERMABOND (GAUZE/BANDAGES/DRESSINGS) ×1
APL SKNCLS STERI-STRIP NONHPOA (GAUZE/BANDAGES/DRESSINGS) ×1
APL SRG 60D 8 XTD TIP BNDBL (TIP) ×1
BAG DECANTER FOR FLEXI CONT (MISCELLANEOUS) ×2 IMPLANT
BENZOIN TINCTURE PRP APPL 2/3 (GAUZE/BANDAGES/DRESSINGS) ×2 IMPLANT
BIT DRILL NEURO 2X3.1 SFT TUCH (MISCELLANEOUS) IMPLANT
BLADE SURG ROTATE 9660 (MISCELLANEOUS) IMPLANT
BRUSH SCRUB EZ PLAIN DRY (MISCELLANEOUS) ×2 IMPLANT
BUR ACORN 6.0 (BURR) ×2 IMPLANT
BUR MATCHSTICK NEURO 3.0 LAGG (BURR) ×2 IMPLANT
CANISTER SUCTION 2500CC (MISCELLANEOUS) ×2 IMPLANT
CLOTH BEACON ORANGE TIMEOUT ST (SAFETY) ×2 IMPLANT
CONT SPEC 4OZ CLIKSEAL STRL BL (MISCELLANEOUS) ×2 IMPLANT
DERMABOND ADHESIVE PROPEN (GAUZE/BANDAGES/DRESSINGS) ×1
DERMABOND ADVANCED .7 DNX6 (GAUZE/BANDAGES/DRESSINGS) IMPLANT
DRAPE LAPAROTOMY 100X72X124 (DRAPES) ×2 IMPLANT
DRAPE MICROSCOPE LEICA (MISCELLANEOUS) ×2 IMPLANT
DRAPE POUCH INSTRU U-SHP 10X18 (DRAPES) ×2 IMPLANT
DRAPE SURG 17X23 STRL (DRAPES) ×8 IMPLANT
DRILL NEURO 2X3.1 SOFT TOUCH (MISCELLANEOUS) ×2
DRSG OPSITE POSTOP 4X10 (GAUZE/BANDAGES/DRESSINGS) ×1 IMPLANT
DURASEAL APPLICATOR TIP (TIP) ×1 IMPLANT
DURASEAL SPINE SEALANT 3ML (MISCELLANEOUS) ×1 IMPLANT
ELECT BLADE 4.0 EZ CLEAN MEGAD (MISCELLANEOUS) ×2
ELECT REM PT RETURN 9FT ADLT (ELECTROSURGICAL) ×2
ELECTRODE BLDE 4.0 EZ CLN MEGD (MISCELLANEOUS) ×1 IMPLANT
ELECTRODE REM PT RTRN 9FT ADLT (ELECTROSURGICAL) ×1 IMPLANT
GAUZE SPONGE 4X4 16PLY XRAY LF (GAUZE/BANDAGES/DRESSINGS) IMPLANT
GLOVE BIO SURGEON STRL SZ8.5 (GLOVE) ×2 IMPLANT
GLOVE BIOGEL PI IND STRL 8 (GLOVE) IMPLANT
GLOVE BIOGEL PI INDICATOR 8 (GLOVE) ×1
GLOVE ECLIPSE 7.5 STRL STRAW (GLOVE) ×2 IMPLANT
GLOVE EXAM NITRILE LRG STRL (GLOVE) IMPLANT
GLOVE EXAM NITRILE MD LF STRL (GLOVE) ×1 IMPLANT
GLOVE EXAM NITRILE XL STR (GLOVE) IMPLANT
GLOVE EXAM NITRILE XS STR PU (GLOVE) IMPLANT
GLOVE INDICATOR 7.0 STRL GRN (GLOVE) ×2 IMPLANT
GLOVE INDICATOR 7.5 STRL GRN (GLOVE) ×1 IMPLANT
GLOVE SS BIOGEL STRL SZ 8 (GLOVE) ×1 IMPLANT
GLOVE SUPERSENSE BIOGEL SZ 8 (GLOVE) ×1
GLOVE SURG SS PI 7.0 STRL IVOR (GLOVE) ×4 IMPLANT
GOWN BRE IMP SLV AUR LG STRL (GOWN DISPOSABLE) IMPLANT
GOWN BRE IMP SLV AUR XL STRL (GOWN DISPOSABLE) ×6 IMPLANT
GOWN STRL REIN 2XL LVL4 (GOWN DISPOSABLE) IMPLANT
KIT BASIN OR (CUSTOM PROCEDURE TRAY) ×2 IMPLANT
KIT ROOM TURNOVER OR (KITS) ×2 IMPLANT
NDL HYPO 21X1.5 SAFETY (NEEDLE) IMPLANT
NEEDLE HYPO 21X1.5 SAFETY (NEEDLE) IMPLANT
NEEDLE HYPO 22GX1.5 SAFETY (NEEDLE) ×2 IMPLANT
NS IRRIG 1000ML POUR BTL (IV SOLUTION) ×2 IMPLANT
PACK LAMINECTOMY NEURO (CUSTOM PROCEDURE TRAY) ×2 IMPLANT
PAD ARMBOARD 7.5X6 YLW CONV (MISCELLANEOUS) ×6 IMPLANT
PATTIES SURGICAL .5 X1 (DISPOSABLE) IMPLANT
RUBBERBAND STERILE (MISCELLANEOUS) ×4 IMPLANT
SPONGE GAUZE 4X4 12PLY (GAUZE/BANDAGES/DRESSINGS) ×2 IMPLANT
SPONGE SURGIFOAM ABS GEL SZ50 (HEMOSTASIS) ×2 IMPLANT
STRIP CLOSURE SKIN 1/2X4 (GAUZE/BANDAGES/DRESSINGS) ×2 IMPLANT
SUT PROLENE 6 0 BV (SUTURE) ×2 IMPLANT
SUT VIC AB 1 CT1 18XBRD ANBCTR (SUTURE) ×1 IMPLANT
SUT VIC AB 1 CT1 8-18 (SUTURE) ×2
SUT VIC AB 2-0 CP2 18 (SUTURE) ×2 IMPLANT
SYR 20CC LL (SYRINGE) IMPLANT
SYR 20ML ECCENTRIC (SYRINGE) ×2 IMPLANT
TOWEL OR 17X24 6PK STRL BLUE (TOWEL DISPOSABLE) ×2 IMPLANT
TOWEL OR 17X26 10 PK STRL BLUE (TOWEL DISPOSABLE) ×2 IMPLANT
WATER STERILE IRR 1000ML POUR (IV SOLUTION) ×2 IMPLANT

## 2013-01-07 NOTE — Progress Notes (Signed)
Patient ID: Daniel Monroe, male   DOB: 25-Sep-1955, 57 y.o.   MRN: [id number] Subjective:  The patient is alert and pleasant. He looks well. He has no complaints.  Objective: Vital signs in last 24 hours: Temp:  [97.8 F (36.6 C)-98.2 F (36.8 C)] 98.2 F (36.8 C) (07/21 1512) Pulse Rate:  [77-91] 91 (07/21 1515) Resp:  [12-22] 22 (07/21 1515) BP: (134-145)/(80-88) 145/88 mmHg (07/21 1515) SpO2:  [96 %-98 %] 98 % (07/21 1515)  Intake/Output from previous day:   Intake/Output this shift: Total I/O In: 1500 [I.V.:1500] Out: -   Physical exam patient is moving his lower extremities well.  Lab Results: No results found for this basename: WBC, HGB, HCT, PLT,  in the last 72 hours BMET No results found for this basename: NA, K, CL, CO2, GLUCOSE, BUN, CREATININE, CALCIUM,  in the last 72 hours  Studies/Results: Dg Lumbar Spine 2-3 Views  01/07/2013   *RADIOLOGY REPORT*  Clinical Data: L4-5 microdiskectomy.  LUMBAR SPINE - 2-3 VIEW  Comparison: MR lumbar spine 11/27/2012.  Findings: Two intraoperative cross-table lateral views of the lumbar spine are submitted. Numbering system utilized on 11/27/2012 is preserved.  The first image, taken at 1245 hours, shows a surgical instrument tip projecting posterior to the L5-S1 facet joints.  The second image, taken at 1345 hours, shows a surgical instrument tip projecting posterior to the L4-5 interspace.  IMPRESSION: Intraoperative visualization, as above.   Original Report Authenticated By: Leanna Battles, M.D.    Assessment/Plan: The patient is doing well. We will keep him at bedrest with head of bed less than 20 secondary to the durotomy. We will likely mobilize him tomorrow.  LOS: 0 days     Daniel Monroe D 01/07/2013, 3:29 PM

## 2013-01-07 NOTE — Anesthesia Preprocedure Evaluation (Addendum)
Anesthesia Evaluation  Patient identified by MRN, date of birth, ID band Patient awake    Reviewed: Allergy & Precautions, H&P , NPO status , Patient's Chart, lab work & pertinent test results  History of Anesthesia Complications Negative for: history of anesthetic complications  Airway Mallampati: II TM Distance: >3 FB Neck ROM: Full    Dental  (+) Edentulous Upper, Poor Dentition and Dental Advisory Given   Pulmonary shortness of breath, Current Smoker,  breath sounds clear to auscultation  Pulmonary exam normal       Cardiovascular negative cardio ROS  Rhythm:Regular Rate:Normal     Neuro/Psych Chronic back pain: narcotics    GI/Hepatic Neg liver ROS,   Endo/Other  negative endocrine ROS  Renal/GU negative Renal ROS     Musculoskeletal   Abdominal   Peds  Hematology   Anesthesia Other Findings   Reproductive/Obstetrics                         Anesthesia Physical Anesthesia Plan  ASA: II  Anesthesia Plan: General   Post-op Pain Management:    Induction:   Airway Management Planned: Oral ETT  Additional Equipment:   Intra-op Plan:   Post-operative Plan: Extubation in OR  Informed Consent: I have reviewed the patients History and Physical, chart, labs and discussed the procedure including the risks, benefits and alternatives for the proposed anesthesia with the patient or authorized representative who has indicated his/her understanding and acceptance.   Dental advisory given  Plan Discussed with: CRNA and Surgeon  Anesthesia Plan Comments: (Plan routine monitors, GETA)        Anesthesia Quick Evaluation

## 2013-01-07 NOTE — Anesthesia Postprocedure Evaluation (Signed)
  Anesthesia Post-op Note  Patient: Daniel Monroe  Procedure(s) Performed: Procedure(s) with comments: LUMBAR FOUR LUMBAR FIVE LAMINECTOMY/DECOMPRESSION MICRODISCECTOMY 1 LEVEL (Right) - RIGHT L45 microdiskectomy  Patient Location: PACU  Anesthesia Type:General  Level of Consciousness: awake, alert , oriented and patient cooperative  Airway and Oxygen Therapy: Patient Spontanous Breathing and Patient connected to nasal cannula oxygen  Post-op Pain: mild  Post-op Assessment: Post-op Vital signs reviewed, Patient's Cardiovascular Status Stable, Respiratory Function Stable, Patent Airway, No signs of Nausea or vomiting and Pain level controlled  Post-op Vital Signs: Reviewed and stable  Complications: No apparent anesthesia complications

## 2013-01-07 NOTE — Preoperative (Signed)
Beta Blockers   Reason not to administer Beta Blockers:Not Applicable 

## 2013-01-07 NOTE — Transfer of Care (Signed)
Immediate Anesthesia Transfer of Care Note  Patient: Daniel Monroe  Procedure(s) Performed: Procedure(s) with comments: LUMBAR FOUR LUMBAR FIVE LAMINECTOMY/DECOMPRESSION MICRODISCECTOMY 1 LEVEL (Right) - RIGHT L45 microdiskectomy  Patient Location: PACU  Anesthesia Type:General  Level of Consciousness: awake, alert , oriented and patient cooperative  Airway & Oxygen Therapy: Patient Spontanous Breathing and Patient connected to nasal cannula oxygen  Post-op Assessment: Report given to PACU RN and Post -op Vital signs reviewed and stable  Post vital signs: Reviewed and stable  Complications: No apparent anesthesia complications

## 2013-01-07 NOTE — H&P (Signed)
Subjective: The patient is a 57 year old white male who has complained of back and right leg pain. He has failed medical management and was worked up with a lumbar MRI. This demonstrated a large herniated disc at L4-5. I discussed the various treatment options with the patient including surgery. He has weighed the risks, benefits, and alternatives surgery and decided proceed with a right L4-5 discectomy.   Past Medical History  Diagnosis Date  . Shortness of breath     with exertion; pt says its due to smoking  . Back pain   . Current smoker     1 ppd    Past Surgical History  Procedure Laterality Date  . Rotator cuff repair Left   . Total shoulder arthroplasty Right   . Hernia repair      as a child    No Known Allergies  History  Substance Use Topics  . Smoking status: Current Every Day Smoker -- 1.00 packs/day for 35 years    Types: Cigarettes  . Smokeless tobacco: Not on file  . Alcohol Use: 0.0 oz/week    5-6 Cans of beer per week     Comment: Daily    History reviewed. No pertinent family history. Prior to Admission medications   Medication Sig Start Date End Date Taking? Authorizing Provider  cyclobenzaprine (FLEXERIL) 10 MG tablet Take 10 mg by mouth 3 (three) times daily as needed for muscle spasms.   Yes Historical Provider, MD  etodolac (LODINE) 400 MG tablet Take 400 mg by mouth 2 (two) times daily.   Yes Historical Provider, MD  gabapentin (NEURONTIN) 300 MG capsule Take 300 mg by mouth 3 (three) times daily.   Yes Historical Provider, MD  HYDROmorphone (DILAUDID) 4 MG tablet Take 4 mg by mouth every 6 (six) hours as needed for pain.   Yes Historical Provider, MD  Tetrahydrozoline HCl (VISINE OP) Apply 1-2 drops to eye 2 (two) times daily as needed. For dry eyes    Historical Provider, MD     Review of Systems  Positive ROS: As above  All other systems have been reviewed and were otherwise negative with the exception of those mentioned in the HPI and as  above.  Objective: Vital signs in last 24 hours: Temp:  [97.8 F (36.6 C)] 97.8 F (36.6 C) (07/21 0916) Pulse Rate:  [77] 77 (07/21 0916) Resp:  [20] 20 (07/21 0916) BP: (134)/(80) 134/80 mmHg (07/21 0916) SpO2:  [96 %] 96 % (07/21 0916)  General Appearance: Alert, cooperative, no distress, appears stated age Head: Normocephalic, without obvious abnormality, atraumatic Eyes: PERRL, conjunctiva/corneas clear, EOM's intact, fundi benign, both eyes      Ears: Normal TM's and external ear canals, both ears Throat: Lips, mucosa, and tongue normal; teeth and gums normal Neck: Supple, symmetrical, trachea midline, no adenopathy; thyroid: No enlargement/tenderness/nodules; no carotid bruit or JVD Back: Symmetric, no curvature, ROM normal, no CVA tenderness Lungs: Clear to auscultation bilaterally, respirations unlabored Heart: Regular rate and rhythm, S1 and S2 normal, no murmur, rub or gallop Abdomen: Soft, non-tender, bowel sounds active all four quadrants, no masses, no organomegaly Extremities: Extremities normal, atraumatic, no cyanosis or edema Pulses: 2+ and symmetric all extremities Skin: Skin color, texture, turgor normal, no rashes or lesions  NEUROLOGIC:   Mental status: alert and oriented, no aphasia, good attention span, Fund of knowledge/ memory ok Motor Exam - grossly normal Sensory Exam - grossly normal Reflexes:  Coordination - grossly normal Gait - grossly normal Balance - grossly  normal Cranial Nerves: I: smell Not tested  II: visual acuity  OS: Normal    OD: Normal   II: visual fields Full to confrontation  II: pupils Equal, round, reactive to light  III,VII: ptosis None  III,IV,VI: extraocular muscles  Full ROM  V: mastication Normal  V: facial light touch sensation  Normal  V,VII: corneal reflex  Present  VII: facial muscle function - upper  Normal  VII: facial muscle function - lower Normal  VIII: hearing Not tested  IX: soft palate elevation  Normal   IX,X: gag reflex Present  XI: trapezius strength  5/5  XI: sternocleidomastoid strength 5/5  XI: neck flexion strength  5/5  XII: tongue strength  Normal    Data Review Lab Results  Component Value Date   WBC 5.4 01/03/2013   HGB 13.5 01/03/2013   HCT 37.6* 01/03/2013   MCV 92.6 01/03/2013   PLT 379 01/03/2013   Lab Results  Component Value Date   NA 134* 01/03/2013   K 4.4 01/03/2013   CL 97 01/03/2013   CO2 27 01/03/2013   BUN 5* 01/03/2013   CREATININE 0.60 01/03/2013   GLUCOSE 84 01/03/2013   No results found for this basename: INR, PROTIME    Assessment/Plan: Right L4-5 herniated disc, spinal stenosis, lumbago, lumbar radiculopathy: I discussed situation with the patient. I reviewed his MR scan with them and pointed out the abnormalities. We have discussed the various treatment options including surgery. I described the surgical option of a right L4-5 discectomy. I described the surgery to him. I have shown him surgical models. We have discussed the risks, benefits, alternatives, and likelihood of achieving our goals with surgery. I've answered all the patient's questions. He has decided to proceed with surgery.   Joslyne Marshburn D 01/07/2013 11:57 AM

## 2013-01-07 NOTE — Op Note (Signed)
Brief history: The patient is a 57 year old white male who has complained of back and right leg pain consistent with a lumbar radiculopathy. He has failed medical management and was worked up with a lumbar MRI. This demonstrated a large herniated disc at L4-5 with severe spinal stenosis. I discussed the various treatment option with the patient and his wife including surgery. The patient has weighed the risks, benefits, and alternatives surgery and decided proceed with a right L4-5 discectomy.  Preoperative diagnosis: Right L4-5 herniated disc, spinal stenosis, lumbago, lumbar radiculopathy  Postoperative diagnosis: Same  Procedure: L4-5 Intervertebral discectomy using micro-dissection  Surgeon: Dr. Delma Officer  Asst.: Dr. Shirlean Kelly  Anesthesia: Gen. endotracheal  Estimated blood loss: 50 cc  Drains: None  Complications: Durotomy  Description of procedure: The patient was brought to the operating room by the anesthesia team. General endotracheal anesthesia was induced. The patient was turned to the prone position on the Wilson frame. The patient's lumbosacral region was then prepared with Betadine scrub and Betadine solution. Sterile drapes were applied.  I then injected the area to be incised with Marcaine with epinephrine solution. I then used a scalpel to make a linear midline incision over the L4-5 intervertebral disc space. I then used electrocautery to perform a right sided subperiosteal dissection exposing the spinous process and lamina of L4 and L5. We obtained intraoperative radiograph to confirm our location. I then inserted the [facility] retractor for exposure.  We then brought the operative microscope into the field. Under its magnification and illumination we completed the microdissection. I used a high-speed drill to perform a laminotomy at L4 on the right. I then used a Kerrison punches to widen the laminotomy and removed the ligamentum flavum at L4-5 on the right as  well as a cephalad aspect of the L5 lamina. We did encounter quite a bit of epidural fibrosis, i.e. the ligamentum flavum was quite hearing to the dura. In freeing up the dura we decreased small durotomy in the dorsal midline. This was closed with 2 figure-of-eight 6-0 Prolene sutures.. We then used microdissection to free up the thecal sac and the right L5 nerve root from the epidural tissue. I then used a Kerrison punch to perform a foraminotomy at about the L5 nerve root. We then using the nerve root retractor to gently retract the thecal sac and the right L5 nerve root medially. This exposed the intervertebral disc. We identified the ruptured disc and remove it with the pituitary forceps. We performed a partial intervertebral discectomy using the pituitary forceps and the Epstein curettes.  I then palpated along the ventral surface of the thecal sac and along exit route of the right L5 nerve root and noted that the neural structures were well decompressed. This completed the decompression.  We then obtained hemostasis using bipolar electrocautery. We irrigated the wound out with bacitracin solution. We had anesthesia Valsalva the patient. This demonstrated a good durotomy closure. I placed a DuraSeal over the durotomy repair. We then removed the retractor. We then reapproximated the patient's thoracolumbar fascia with interrupted #1 Vicryl suture. We then reapproximated the patient's subcutaneous tissue with interrupted 3-0 Vicryl suture. We then reapproximated patient's skin with Steri-Strips and benzoin. The was then coated with bacitracin ointment. The drapes were removed. The patient was subsequently returned to the supine position where they were extubated by the anesthesia team. The patient was then transported to the postanesthesia care unit in stable condition. All sponge instrument and needle counts were reportedly correct at the  end of this case.

## 2013-01-08 MED ORDER — DSS 100 MG PO CAPS: 100.0000 mg | ORAL_CAPSULE | Freq: Two times a day (BID) | ORAL | Status: DC

## 2013-01-08 MED ORDER — HYDROMORPHONE HCL 4 MG PO TABS: 4.0000 mg | ORAL_TABLET | ORAL | Status: DC | PRN

## 2013-01-08 MED ORDER — DIAZEPAM 5 MG PO TABS: 5.0000 mg | ORAL_TABLET | Freq: Four times a day (QID) | ORAL | Status: DC | PRN

## 2013-01-08 NOTE — Progress Notes (Signed)
UR COMPLETED  

## 2013-01-08 NOTE — Discharge Summary (Signed)
Physician Discharge Summary  Patient ID: Daniel Monroe MRN: [id number] DOB/AGE: 02-12-1956 57 y.o.  Admit date: 01/07/2013 Discharge date: 01/08/2013  Admission Diagnoses:L4-5 herniated disc, spinal stenosis, lumbago, lumbar radiculopathy  Discharge Diagnoses: the same Active Problems:   * No active hospital problems. *   Discharged Condition: good home Hospital Course: I performed a right L4-5 discectomy on the patient on 01/07/2013. At surgery there was a small durotomy. This was suture repaired.  The patient's postoperative course has been unremarkable. He denies headaches. He wants to go home. He is to be mobilized if he does well he will be discharged this afternoon.  Consults:none Significant Diagnostic Studies:none Treatments:right L4-5 discectomy using microdissection Discharge Exam: Blood pressure 116/75, pulse 86, temperature 97.7 F (36.5 C), temperature source Oral, resp. rate 18, SpO2 97.00%. Patient is alert and oriented. His strength is grossly normal his lower extremities. His dressing is clean and dry. He denies headaches  Disposition: home  Discharge Orders   Future Orders Complete By Expires     Call MD for:  difficulty breathing, headache or visual disturbances  As directed     Call MD for:  extreme fatigue  As directed     Call MD for:  hives  As directed     Call MD for:  persistant dizziness or light-headedness  As directed     Call MD for:  persistant nausea and vomiting  As directed     Call MD for:  redness, tenderness, or signs of infection (pain, swelling, redness, odor or green/yellow discharge around incision site)  As directed     Call MD for:  severe uncontrolled pain  As directed     Call MD for:  temperature >100.4  As directed     Diet - low sodium heart healthy  As directed     Discharge instructions  As directed     Comments:      Call [phone] for a followup appointment. Take a stool softener while you are using pain medications.    Driving Restrictions  As directed     Comments:      Do not drive for 2 weeks.    Increase activity slowly  As directed     Lifting restrictions  As directed     Comments:      Do not lift more than 5 pounds. No excessive bending or twisting.    May shower / Bathe  As directed     Comments:      He may shower after the pain she is removed 3 days after surgery. Leave the incision alone.    Remove dressing in 48 hours  As directed     Comments:      Your stitches are under the scan and will dissolve by themselves. The Steri-Strips will fall off after you take a few showers. Do not rub back or pick at the wound, Leave the wound alone.        Medication List    STOP taking these medications       cyclobenzaprine 10 MG tablet  Commonly known as:  FLEXERIL     gabapentin 300 MG capsule  Commonly known as:  NEURONTIN      TAKE these medications       diazepam 5 MG tablet  Commonly known as:  VALIUM  Take 1 tablet (5 mg total) by mouth every 6 (six) hours as needed (muscle spasms).     DSS 100 MG Caps  Take  100 mg by mouth 2 (two) times daily.     etodolac 400 MG tablet  Commonly known as:  LODINE  Take 400 mg by mouth 2 (two) times daily.     HYDROmorphone 4 MG tablet  Commonly known as:  DILAUDID  Take 1 tablet (4 mg total) by mouth every 4 (four) hours as needed.     HYDROmorphone 4 MG tablet  Commonly known as:  DILAUDID  Take 4 mg by mouth every 6 (six) hours as needed for pain.     VISINE OP  Apply 1-2 drops to eye 2 (two) times daily as needed. For dry eyes         Signed: Cristi Loron 01/08/2013, 6:37 AM

## 2013-01-10 ENCOUNTER — Encounter (HOSPITAL_COMMUNITY): Payer: Self-pay | Admitting: Neurosurgery

## 2014-07-23 ENCOUNTER — Encounter (HOSPITAL_BASED_OUTPATIENT_CLINIC_OR_DEPARTMENT_OTHER): Payer: Self-pay | Admitting: *Deleted

## 2014-07-24 ENCOUNTER — Encounter (HOSPITAL_BASED_OUTPATIENT_CLINIC_OR_DEPARTMENT_OTHER): Payer: Self-pay | Admitting: *Deleted

## 2014-07-25 ENCOUNTER — Encounter (HOSPITAL_BASED_OUTPATIENT_CLINIC_OR_DEPARTMENT_OTHER)
Admission: RE | Disposition: A | Discharge status: Home / Self Care | Payer: Self-pay | Source: Ambulatory Visit | Attending: Orthopedic Surgery

## 2014-07-25 ENCOUNTER — Ambulatory Visit (HOSPITAL_BASED_OUTPATIENT_CLINIC_OR_DEPARTMENT_OTHER)
Admission: RE | Admit: 2014-07-25 | Discharge: 2014-07-26 | Disposition: A | Discharge status: Home / Self Care | Source: Ambulatory Visit | Attending: Orthopedic Surgery | Admitting: Orthopedic Surgery

## 2014-07-25 ENCOUNTER — Ambulatory Visit (HOSPITAL_BASED_OUTPATIENT_CLINIC_OR_DEPARTMENT_OTHER): Admitting: Anesthesiology

## 2014-07-25 ENCOUNTER — Encounter (HOSPITAL_BASED_OUTPATIENT_CLINIC_OR_DEPARTMENT_OTHER): Payer: Self-pay | Admitting: Anesthesiology

## 2014-07-25 DIAGNOSIS — K219 Gastro-esophageal reflux disease without esophagitis: Secondary | ICD-10-CM | POA: Diagnosis not present

## 2014-07-25 DIAGNOSIS — M75102 Unspecified rotator cuff tear or rupture of left shoulder, not specified as traumatic: Secondary | ICD-10-CM | POA: Diagnosis present

## 2014-07-25 DIAGNOSIS — J449 Chronic obstructive pulmonary disease, unspecified: Secondary | ICD-10-CM | POA: Diagnosis not present

## 2014-07-25 DIAGNOSIS — F17210 Nicotine dependence, cigarettes, uncomplicated: Secondary | ICD-10-CM | POA: Insufficient documentation

## 2014-07-25 DIAGNOSIS — M75122 Complete rotator cuff tear or rupture of left shoulder, not specified as traumatic: Secondary | ICD-10-CM

## 2014-07-25 HISTORY — DX: Gastro-esophageal reflux disease without esophagitis: K21.9

## 2014-07-25 HISTORY — PX: SHOULDER ARTHROSCOPY WITH ROTATOR CUFF REPAIR: SHX5685

## 2014-07-25 HISTORY — DX: Unspecified rotator cuff tear or rupture of left shoulder, not specified as traumatic: M75.102

## 2014-07-25 HISTORY — DX: Acute upper respiratory infection, unspecified: J06.9

## 2014-07-25 HISTORY — DX: Complete rotator cuff tear or rupture of left shoulder, not specified as traumatic: M75.122

## 2014-07-25 HISTORY — DX: Strain of muscle, fascia and tendon of other parts of biceps, unspecified arm, initial encounter: S46.219A

## 2014-07-25 HISTORY — DX: Chronic obstructive pulmonary disease, unspecified: J44.9

## 2014-07-25 LAB — POCT HEMOGLOBIN-HEMACUE: Hemoglobin: 13.3 g/dL (ref 13.0–17.0)

## 2014-07-25 SURGERY — ARTHROSCOPY, SHOULDER, WITH ROTATOR CUFF REPAIR
Anesthesia: General | Site: Shoulder | Laterality: Left

## 2014-07-25 MED ORDER — BISACODYL 10 MG RE SUPP: 10.0000 mg | Freq: Every day | RECTAL | Status: DC | PRN

## 2014-07-25 MED ORDER — HYDROMORPHONE HCL 1 MG/ML IJ SOLN: 0.2500 mg | INTRAMUSCULAR | Status: DC | PRN

## 2014-07-25 MED ORDER — CEFAZOLIN SODIUM 1-5 GM-% IV SOLN
1.0000 g | Freq: Four times a day (QID) | INTRAVENOUS | Status: AC
  Administered 2014-07-25 – 2014-07-26 (×3): 1 g via INTRAVENOUS

## 2014-07-25 MED ORDER — SODIUM CHLORIDE 0.9 % IR SOLN
Status: DC | PRN
  Administered 2014-07-25: 20000 mL

## 2014-07-25 MED ORDER — METHOCARBAMOL 500 MG PO TABS
500.0000 mg | ORAL_TABLET | Freq: Four times a day (QID) | ORAL | Status: DC | PRN
  Administered 2014-07-25: 500 mg via ORAL
  Filled 2014-07-25: qty 1

## 2014-07-25 MED ORDER — PHENYLEPHRINE HCL 10 MG/ML IJ SOLN
10.0000 mg | INTRAVENOUS | Status: DC | PRN
  Administered 2014-07-25: 50 ug/min via INTRAVENOUS

## 2014-07-25 MED ORDER — ONDANSETRON HCL 4 MG/2ML IJ SOLN: 4.0000 mg | Freq: Four times a day (QID) | INTRAMUSCULAR | Status: DC | PRN

## 2014-07-25 MED ORDER — MIDAZOLAM HCL 2 MG/2ML IJ SOLN
1.0000 mg | INTRAMUSCULAR | Status: DC | PRN
  Administered 2014-07-25: 2 mg via INTRAVENOUS

## 2014-07-25 MED ORDER — EPHEDRINE SULFATE 50 MG/ML IJ SOLN
INTRAMUSCULAR | Status: DC | PRN
Start: 2014-07-25 — End: 2014-07-25
  Administered 2014-07-25 (×2): 10 mg via INTRAVENOUS

## 2014-07-25 MED ORDER — OXYCODONE HCL 5 MG/5ML PO SOLN: 5.0000 mg | Freq: Once | ORAL | Status: AC | PRN

## 2014-07-25 MED ORDER — OXYCODONE-ACETAMINOPHEN 5-325 MG PO TABS
1.0000 | ORAL_TABLET | ORAL | Status: DC | PRN
  Administered 2014-07-25: 2 via ORAL
  Filled 2014-07-25: qty 2

## 2014-07-25 MED ORDER — MAGNESIUM CITRATE PO SOLN: 1.0000 | Freq: Once | ORAL | Status: AC | PRN

## 2014-07-25 MED ORDER — OXYCODONE-ACETAMINOPHEN 10-325 MG PO TABS: 1.0000 | ORAL_TABLET | Freq: Four times a day (QID) | ORAL | Status: DC | PRN

## 2014-07-25 MED ORDER — BUPIVACAINE-EPINEPHRINE (PF) 0.5% -1:200000 IJ SOLN
INTRAMUSCULAR | Status: DC | PRN
  Administered 2014-07-25: 25 mL via PERINEURAL

## 2014-07-25 MED ORDER — SENNA-DOCUSATE SODIUM 8.6-50 MG PO TABS: 2.0000 | ORAL_TABLET | Freq: Every day | ORAL | Status: DC

## 2014-07-25 MED ORDER — CEFAZOLIN SODIUM 1-5 GM-% IV SOLN
INTRAVENOUS | Status: AC
  Filled 2014-07-25: qty 100

## 2014-07-25 MED ORDER — FENTANYL CITRATE 0.05 MG/ML IJ SOLN
INTRAMUSCULAR | Status: AC
  Filled 2014-07-25: qty 2

## 2014-07-25 MED ORDER — SENNA 8.6 MG PO TABS
1.0000 | ORAL_TABLET | Freq: Two times a day (BID) | ORAL | Status: DC
  Administered 2014-07-25: 8.6 mg via ORAL
  Filled 2014-07-25: qty 1

## 2014-07-25 MED ORDER — DEXTROSE 5 % IV SOLN: 500.0000 mg | Freq: Four times a day (QID) | INTRAVENOUS | Status: DC | PRN

## 2014-07-25 MED ORDER — CEFAZOLIN SODIUM-DEXTROSE 2-3 GM-% IV SOLR
2.0000 g | INTRAVENOUS | Status: AC
  Administered 2014-07-25: 2 g via INTRAVENOUS

## 2014-07-25 MED ORDER — BUDESONIDE-FORMOTEROL FUMARATE 160-4.5 MCG/ACT IN AERO
2.0000 | INHALATION_SPRAY | Freq: Two times a day (BID) | RESPIRATORY_TRACT | Status: DC
  Administered 2014-07-25: 2 via RESPIRATORY_TRACT

## 2014-07-25 MED ORDER — MIDAZOLAM HCL 2 MG/2ML IJ SOLN
INTRAMUSCULAR | Status: AC
  Filled 2014-07-25: qty 2

## 2014-07-25 MED ORDER — HYDROMORPHONE HCL 1 MG/ML IJ SOLN
0.5000 mg | INTRAMUSCULAR | Status: DC | PRN
  Administered 2014-07-26: 0.5 mg via INTRAVENOUS
  Administered 2014-07-26: 1 mg via INTRAVENOUS
  Filled 2014-07-25: qty 1

## 2014-07-25 MED ORDER — OXYCODONE HCL 5 MG PO TABS: 5.0000 mg | ORAL_TABLET | Freq: Once | ORAL | Status: AC | PRN

## 2014-07-25 MED ORDER — SODIUM CHLORIDE 0.9 % IV SOLN
INTRAVENOUS | Status: DC
  Administered 2014-07-25: 14:00:00 via INTRAVENOUS
  Administered 2014-07-26: 1 mL via INTRAVENOUS

## 2014-07-25 MED ORDER — DOCUSATE SODIUM 100 MG PO CAPS
100.0000 mg | ORAL_CAPSULE | Freq: Two times a day (BID) | ORAL | Status: DC
  Administered 2014-07-25: 100 mg via ORAL
  Filled 2014-07-25: qty 1

## 2014-07-25 MED ORDER — BACLOFEN 10 MG PO TABS: 10.0000 mg | ORAL_TABLET | Freq: Three times a day (TID) | ORAL | Status: DC

## 2014-07-25 MED ORDER — FENTANYL CITRATE 0.05 MG/ML IJ SOLN
50.0000 ug | INTRAMUSCULAR | Status: DC | PRN
  Administered 2014-07-25: 100 ug via INTRAVENOUS

## 2014-07-25 MED ORDER — SUCCINYLCHOLINE CHLORIDE 20 MG/ML IJ SOLN
INTRAMUSCULAR | Status: DC | PRN
  Administered 2014-07-25: 100 mg via INTRAVENOUS

## 2014-07-25 MED ORDER — DIAZEPAM 5 MG PO TABS
5.0000 mg | ORAL_TABLET | Freq: Four times a day (QID) | ORAL | Status: DC | PRN
  Administered 2014-07-26: 5 mg via ORAL
  Filled 2014-07-25: qty 1

## 2014-07-25 MED ORDER — ONDANSETRON HCL 4 MG PO TABS: 4.0000 mg | ORAL_TABLET | Freq: Four times a day (QID) | ORAL | Status: DC | PRN

## 2014-07-25 MED ORDER — ONDANSETRON HCL 4 MG/2ML IJ SOLN: 4.0000 mg | Freq: Once | INTRAMUSCULAR | Status: AC | PRN

## 2014-07-25 MED ORDER — POLYETHYLENE GLYCOL 3350 17 G PO PACK: 17.0000 g | PACK | Freq: Every day | ORAL | Status: DC | PRN

## 2014-07-25 MED ORDER — LACTATED RINGERS IV SOLN
INTRAVENOUS | Status: DC
  Administered 2014-07-25: 09:00:00 via INTRAVENOUS

## 2014-07-25 MED ORDER — ONDANSETRON HCL 4 MG PO TABS: 4.0000 mg | ORAL_TABLET | Freq: Three times a day (TID) | ORAL | Status: DC | PRN

## 2014-07-25 MED ORDER — CEFAZOLIN SODIUM-DEXTROSE 2-3 GM-% IV SOLR
INTRAVENOUS | Status: AC
Start: 2014-07-25 — End: 2014-07-25
  Filled 2014-07-25: qty 50

## 2014-07-25 MED ORDER — MIDAZOLAM HCL 2 MG/ML PO SYRP: 0.5000 mg/kg | ORAL_SOLUTION | Freq: Once | ORAL | Status: AC | PRN

## 2014-07-25 MED ORDER — PREDNISONE (PAK) 10 MG PO TABS: ORAL_TABLET | Freq: Every day | ORAL | Status: DC

## 2014-07-25 MED ORDER — DEXAMETHASONE SODIUM PHOSPHATE 4 MG/ML IJ SOLN
INTRAMUSCULAR | Status: DC | PRN
  Administered 2014-07-25: 10 mg via INTRAVENOUS

## 2014-07-25 MED ORDER — PROPOFOL 10 MG/ML IV BOLUS
INTRAVENOUS | Status: DC | PRN
  Administered 2014-07-25: 200 mg via INTRAVENOUS
  Administered 2014-07-25: 30 mg via INTRAVENOUS

## 2014-07-25 MED ORDER — OXYCODONE HCL 5 MG PO TABS
5.0000 mg | ORAL_TABLET | ORAL | Status: DC | PRN
  Administered 2014-07-26: 10 mg via ORAL
  Filled 2014-07-25: qty 2

## 2014-07-25 MED ORDER — METOCLOPRAMIDE HCL 5 MG/ML IJ SOLN: 5.0000 mg | Freq: Three times a day (TID) | INTRAMUSCULAR | Status: DC | PRN

## 2014-07-25 MED ORDER — ZOLPIDEM TARTRATE 5 MG PO TABS
5.0000 mg | ORAL_TABLET | Freq: Every evening | ORAL | Status: DC | PRN
  Administered 2014-07-25: 5 mg via ORAL
  Filled 2014-07-25: qty 1

## 2014-07-25 MED ORDER — ONDANSETRON HCL 4 MG/2ML IJ SOLN
INTRAMUSCULAR | Status: DC | PRN
  Administered 2014-07-25: 4 mg via INTRAVENOUS

## 2014-07-25 MED ORDER — LIDOCAINE HCL (CARDIAC) 20 MG/ML IV SOLN
INTRAVENOUS | Status: DC | PRN
  Administered 2014-07-25: 70 mg via INTRAVENOUS

## 2014-07-25 MED ORDER — METOCLOPRAMIDE HCL 5 MG PO TABS: 5.0000 mg | ORAL_TABLET | Freq: Three times a day (TID) | ORAL | Status: DC | PRN

## 2014-07-25 SURGICAL SUPPLY — 72 items
ANCH SUT SWLK 19.1X4.75 (Anchor) ×2 IMPLANT
ANCHOR SUT BIO SW 4.75X19.1 (Anchor) ×4 IMPLANT
BLADE CUTTER GATOR 3.5 (BLADE) ×3 IMPLANT
BLADE GREAT WHITE 4.2 (BLADE) IMPLANT
BLADE GREAT WHITE 4.2MM (BLADE)
BLADE SURG 15 STRL LF DISP TIS (BLADE) IMPLANT
BLADE SURG 15 STRL SS (BLADE)
BUR OVAL 6.0 (BURR) ×2 IMPLANT
CANNULA 5.75X71 LONG (CANNULA) ×3 IMPLANT
CANNULA TWIST IN 8.25X7CM (CANNULA) ×4 IMPLANT
CLOSURE STERI-STRIP 1/2X4 (GAUZE/BANDAGES/DRESSINGS) ×1
CLSR STERI-STRIP ANTIMIC 1/2X4 (GAUZE/BANDAGES/DRESSINGS) ×2 IMPLANT
DECANTER SPIKE VIAL GLASS SM (MISCELLANEOUS) IMPLANT
DRAPE INCISE IOBAN 66X45 STRL (DRAPES) ×3 IMPLANT
DRAPE SHOULDER BEACH CHAIR (DRAPES) ×3 IMPLANT
DRAPE U 20/CS (DRAPES) ×3 IMPLANT
DRAPE U-SHAPE 47X51 STRL (DRAPES) ×3 IMPLANT
DRSG PAD ABDOMINAL 8X10 ST (GAUZE/BANDAGES/DRESSINGS) ×3 IMPLANT
DURAPREP 26ML APPLICATOR (WOUND CARE) ×3 IMPLANT
ELECT REM PT RETURN 9FT ADLT (ELECTROSURGICAL)
ELECTRODE REM PT RTRN 9FT ADLT (ELECTROSURGICAL) ×1 IMPLANT
FIBERSTICK 2 (SUTURE) IMPLANT
GAUZE SPONGE 4X4 12PLY STRL (GAUZE/BANDAGES/DRESSINGS) ×3 IMPLANT
GLOVE BIO SURGEON STRL SZ7.5 (GLOVE) ×2 IMPLANT
GLOVE BIO SURGEON STRL SZ8 (GLOVE) ×3 IMPLANT
GLOVE BIOGEL PI IND STRL 7.0 (GLOVE) IMPLANT
GLOVE BIOGEL PI IND STRL 7.5 (GLOVE) IMPLANT
GLOVE BIOGEL PI IND STRL 8 (GLOVE) ×2 IMPLANT
GLOVE BIOGEL PI INDICATOR 7.0 (GLOVE) ×2
GLOVE BIOGEL PI INDICATOR 7.5 (GLOVE) ×2
GLOVE BIOGEL PI INDICATOR 8 (GLOVE) ×4
GLOVE ECLIPSE 6.5 STRL STRAW (GLOVE) ×2 IMPLANT
GLOVE EXAM NITRILE LRG STRL (GLOVE) ×2 IMPLANT
GLOVE ORTHO TXT STRL SZ7.5 (GLOVE) ×3 IMPLANT
GOWN STRL REUS W/ TWL LRG LVL3 (GOWN DISPOSABLE) ×1 IMPLANT
GOWN STRL REUS W/ TWL XL LVL3 (GOWN DISPOSABLE) ×2 IMPLANT
GOWN STRL REUS W/TWL LRG LVL3 (GOWN DISPOSABLE) ×3
GOWN STRL REUS W/TWL XL LVL3 (GOWN DISPOSABLE) ×9
IMMOBILIZER SHOULDER FOAM XLGE (SOFTGOODS) IMPLANT
IV NS IRRIG 3000ML ARTHROMATIC (IV SOLUTION) ×14 IMPLANT
KIT SHOULDER TRACTION (DRAPES) ×3 IMPLANT
LASSO 90 CVE QUICKPAS (DISPOSABLE) IMPLANT
MANIFOLD NEPTUNE II (INSTRUMENTS) ×3 IMPLANT
NDL SCORPION MULTI FIRE (NEEDLE) ×1 IMPLANT
NEEDLE SCORPION MULTI FIRE (NEEDLE) ×6 IMPLANT
PACK ARTHROSCOPY DSU (CUSTOM PROCEDURE TRAY) ×3 IMPLANT
PACK BASIN DAY SURGERY FS (CUSTOM PROCEDURE TRAY) ×3 IMPLANT
SET ARTHROSCOPY TUBING (MISCELLANEOUS) ×3
SET ARTHROSCOPY TUBING LN (MISCELLANEOUS) ×1 IMPLANT
SHEET MEDIUM DRAPE 40X70 STRL (DRAPES) ×3 IMPLANT
SLEEVE SCD COMPRESS KNEE MED (MISCELLANEOUS) ×3 IMPLANT
SLING ARM IMMOBILIZER LRG (SOFTGOODS) ×2 IMPLANT
SLING ARM IMMOBILIZER MED (SOFTGOODS) IMPLANT
SLING ARM LRG ADULT FOAM STRAP (SOFTGOODS) IMPLANT
SLING ARM MED ADULT FOAM STRAP (SOFTGOODS) IMPLANT
SLING ARM XL FOAM STRAP (SOFTGOODS) IMPLANT
SUT FIBERWIRE #2 38 T-5 BLUE (SUTURE) ×3
SUT MNCRL AB 4-0 PS2 18 (SUTURE) ×2 IMPLANT
SUT PDS AB 0 CT 36 (SUTURE) ×1 IMPLANT
SUT PDS AB 1 CT  36 (SUTURE)
SUT PDS AB 1 CT 36 (SUTURE) IMPLANT
SUT TIGER TAPE 7 IN WHITE (SUTURE) ×2 IMPLANT
SUT VIC AB 3-0 SH 27 (SUTURE)
SUT VIC AB 3-0 SH 27X BRD (SUTURE) IMPLANT
SUTURE FIBERWR #2 38 T-5 BLUE (SUTURE) IMPLANT
TAPE FIBER 2MM 7IN #2 BLUE (SUTURE) ×2 IMPLANT
TOWEL OR 17X24 6PK STRL BLUE (TOWEL DISPOSABLE) ×3 IMPLANT
TOWEL OR NON WOVEN STRL DISP B (DISPOSABLE) ×3 IMPLANT
TUBE CONNECTING 20'X1/4 (TUBING)
TUBE CONNECTING 20X1/4 (TUBING) ×1 IMPLANT
WAND STAR VAC 90 (SURGICAL WAND) ×3 IMPLANT
WATER STERILE IRR 1000ML POUR (IV SOLUTION) ×3 IMPLANT

## 2014-07-25 NOTE — Anesthesia Postprocedure Evaluation (Signed)
  Anesthesia Post-op Note  Patient: Daniel Monroe  Procedure(s) Performed: Procedure(s): LEFT SHOULDER ARTHROSCOPY WITH EXTENSIVE DEBRIDEMENT,  ROTATOR CUFF REPAIR (Left)  Patient Location: PACU  Anesthesia Type: General   Level of Consciousness: awake, alert  and oriented  Airway and Oxygen Therapy: Patient Spontanous Breathing  Post-op Pain: mild  Post-op Assessment: Post-op Vital signs reviewed  Post-op Vital Signs: Reviewed  Last Vitals:  Filed Vitals:   07/25/14 1245  BP: 100/74  Pulse: 101  Temp:   Resp: 15    Complications: No apparent anesthesia complications

## 2014-07-25 NOTE — H&P (Signed)
PREOPERATIVE H&P  Chief Complaint: other articular cartilage disorders, left shoulder, complete rotator cuff tear or rupture of left shoulder, not specified as traumatic  HPI: Daniel Monroe is a 59 y.o. male who presents for preoperative history and physical with a diagnosis of other articular cartilage disorders, left shoulder, complete rotator cuff tear or rupture of left shoulder, not specified as traumatic. Symptoms are rated as moderate to severe, and have been worsening.  This is significantly impairing activities of daily living.  He has elected for surgical management. He is left-hand dominant, and has had a past left sided rotator cuff repair done in 2008. He works as as a Product/process development scientistgeneral contractor, and has had persistent pain and weakness. He has failed injections and anti-inflammatories. MRI demonstrated a large recurrent rotator cuff tear.  Past Medical History  Diagnosis Date  . Shortness of breath     with exertion; pt says its due to smoking  . Back pain   . Current smoker     1 ppd  . GERD (gastroesophageal reflux disease)   . URI (upper respiratory infection)   . Left rotator cuff tear   . Rupture of biceps tendon     left  . COPD (chronic obstructive pulmonary disease)     on symbicort   Past Surgical History  Procedure Laterality Date  . Rotator cuff repair Left   . Total shoulder arthroplasty Right   . Hernia repair      as a child  . Lumbar laminectomy/decompression microdiscectomy Right 01/07/2013    Procedure: LUMBAR FOUR LUMBAR FIVE LAMINECTOMY/DECOMPRESSION MICRODISCECTOMY 1 LEVEL;  Surgeon: Cristi LoronJeffrey D Jenkins, MD;  Location: [facility];  Service: Neurosurgery;  Laterality: Right;  RIGHT L45 microdiskectomy   History   Social History  . Marital Status: Single    Spouse Name: N/A    Number of Children: N/A  . Years of Education: N/A   Social History Main Topics  . Smoking status: Current Every Day Smoker -- 1.00 packs/day for 35 years    Types: Cigarettes  .  Smokeless tobacco: None  . Alcohol Use: 0.0 oz/week    5-6 Cans of beer per week     Comment: Daily  . Drug Use: No  . Sexual Activity: Yes   Other Topics Concern  . None   Social History Narrative   History reviewed. No pertinent family history. No Known Allergies Prior to Admission medications   Medication Sig Start Date End Date Taking? Authorizing Provider  budesonide-formoterol (SYMBICORT) 160-4.5 MCG/ACT inhaler Inhale 2 puffs into the lungs 2 (two) times daily.   Yes Historical Provider, MD  predniSONE (STERAPRED UNI-PAK) 10 MG tablet Take by mouth daily.   Yes Historical Provider, MD  diazepam (VALIUM) 5 MG tablet Take 1 tablet (5 mg total) by mouth every 6 (six) hours as needed (muscle spasms). 01/08/13   Tressie StalkerJeffrey Jenkins, MD  Tetrahydrozoline HCl (VISINE OP) Apply 1-2 drops to eye 2 (two) times daily as needed. For dry eyes    Historical Provider, MD     Positive ROS: All other systems have been reviewed and were otherwise negative with the exception of those mentioned in the HPI and as above.  Physical Exam: General: Alert, no acute distress Cardiovascular: No pedal edema Respiratory: No cyanosis, no use of accessory musculature GI: No organomegaly, abdomen is soft and non-tender Skin: No lesions in the area of chief complaint Neurologic: Sensation intact distally Psychiatric: Patient is competent for consent with normal mood and affect Lymphatic: No  axillary or cervical lymphadenopathy  MUSCULOSKELETAL: Left shoulder active motion is 0-180 but he has positive impingement signs and a drop arm sign and weakness with supraspinatus and infraspinatus testing. He has a positive Popeye sign.  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   Eulas Post, MD Cell [phone]   07/25/2014 9:11 AM

## 2014-07-25 NOTE — Progress Notes (Signed)
Oxygen level dropped to upper 80's on room air.  Patient placed on 2 liters nasal cannula.  Upper and lower denture plates returned to patient.

## 2014-07-25 NOTE — Discharge Instructions (Signed)
Diet: As you were doing prior to hospitalization   Shower:  May shower but keep the wounds dry, use an occlusive plastic wrap, NO SOAKING IN TUB.  If the bandage gets wet, change with a clean dry gauze.  Dressing:  You may change your dressing 3-5 days after surgery.  Then change the dressing daily with sterile gauze dressing.    There are sticky tapes (steri-strips) on your wounds and all the stitches are absorbable.  Leave the steri-strips in place when changing your dressings, they will peel off with time, usually 2-3 weeks.  Activity:  Increase activity slowly as tolerated, but follow the weight bearing instructions below.  No lifting or driving for 6 weeks.  Weight Bearing:   Sling at all times.  No lifting left arm. Ok for hand wrist and elbow motion..    To prevent constipation: you may use a stool softener such as -  Colace (over the counter) 100 mg by mouth twice a day  Drink plenty of fluids (prune juice may be helpful) and high fiber foods Miralax (over the counter) for constipation as needed.    Itching:  If you experience itching with your medications, try taking only a single pain pill, or even half a pain pill at a time.  You may take up to 10 pain pills per day, and you can also use benadryl over the counter for itching or also to help with sleep.   Precautions:  If you experience chest pain or shortness of breath - call 911 immediately for transfer to the hospital emergency department!!  If you develop a fever greater that 101 F, purulent drainage from wound, increased redness or drainage from wound, or calf pain -- Call the office at [phone]                                                Follow- Up Appointment:  Please call for an appointment to be [place] in [place] - [phone]

## 2014-07-25 NOTE — Anesthesia Procedure Notes (Addendum)
Anesthesia Regional Block:  Interscalene brachial plexus block  Pre-Anesthetic Checklist: ,, timeout performed, Correct Patient, Correct Site, Correct Laterality, Correct Procedure, Correct Position, site marked, Risks and benefits discussed,  Surgical consent,  Pre-op evaluation,  At surgeon's request and post-op pain management  Laterality: Left and Upper  Prep: chloraprep       Needles:  Injection technique: Single-shot  Needle Type: Echogenic Needle     Needle Length: 5cm 5 cm Needle Gauge: 21 and 21 G    Additional Needles:  Procedures: ultrasound guided (picture in chart) Interscalene brachial plexus block Narrative:  Start time: 07/25/2014 9:20 AM End time: 07/25/2014 9:25 AM Injection made incrementally with aspirations every 5 mL.  Performed by: Personally  Anesthesiologist: CREWS, DAVID A   Procedure Name: Intubation Performed by: York GricePEARSON, Johnchristopher Sarvis W Pre-anesthesia Checklist: Patient identified, Timeout performed, Emergency Drugs available, Suction available and Patient being monitored Patient Re-evaluated:Patient Re-evaluated prior to inductionOxygen Delivery Method: Circle system utilized Preoxygenation: Pre-oxygenation with 100% oxygen Intubation Type: IV induction Ventilation: Mask ventilation without difficulty Laryngoscope Size: Miller and 2 Grade View: Grade I Tube type: Oral Tube size: 7.0 mm Number of attempts: 1 Airway Equipment and Method: Stylet Placement Confirmation: ETT inserted through vocal cords under direct vision,  breath sounds checked- equal and bilateral and positive ETCO2 Secured at: 22 cm Tube secured with: Tape Dental Injury: Teeth and Oropharynx as per pre-operative assessment

## 2014-07-25 NOTE — Progress Notes (Signed)
Assisted Dr. Crews with left, ultrasound guided, interscalene  block. Side rails up, monitors on throughout procedure. See vital signs in flow sheet. Tolerated Procedure well. 

## 2014-07-25 NOTE — Op Note (Signed)
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  ANESTHESIA:   General  PREOPERATIVE INDICATIONS:  Daniel Monroe is a  59 y.o. male who had a previous history of a rotator cuff repair done open who had recurrent massive rotator cuff tear with atrophy and retraction who failed conservative measures and elected for surgical management.    The risks benefits and alternatives were discussed with the patient preoperatively including but not limited to the risks of infection, bleeding, nerve injury, cardiopulmonary complications, the need for revision surgery, among others, and the patient was willing to proceed.   we also discussed the risks for recurrent tear, persistent weakness,  progression of rotator cuff arthropathy, among others and is willing to proceed.  OPERATIVE IMPLANTS: Arthrex bio composite 4.75 mm swivel lock 2 with an inverted fiber tape posteriorly and an inverted fiber wire and fiber tape anteriorly.  OPERATIVE FINDINGS: There was a massive rotator cuff tear with retraction with thickened scar tissue and overall poor quality tissue with some atrophy. The biceps tendon was no longer present. The glenohumeral articular cartilage was in reasonably good condition. There was some mild acetabular is a shin of the acromion. I did not perform a CA ligament release or acromioplasty for this reason. The rotator cuff tissue however was significantly mobile, and I was capable of getting it over to the tuberosity without undue stress.  The bone quality was reasonably good.  OPERATIVE PROCEDURE: The patient is brought to the operating room and placed in supine position. Gen. anesthesia was administered. IV antibiotics were given. Examination under anesthesia demonstrated full motion. He was placed in a semilateral decubitus position and all bony prominences padded. Timeout was performed. Diagnostic arthroscopy carried out with the above-named findings. I used the arthroscopic shaver to debride portions of the labrum as well as the rotator cuff and some of the biceps stump although it was already pretty smooth.  Initially I was very confident that the rotator cuff was not in fact repairable, and I debrided the [facility] quality of the tissue that was most lateral, however after assessment of the tear it was actually pretty mobile, and I elected to proceed with repair given his young age and goal of being able to restore some degree of strength and function and minimize the risk for future rotator cuff arthropathy.  I then debrided the tuberosity, I removed actually loose pieces of previous fiber tape suture, which were effectively foreign body with tied knots.  I performed a very light tubercle plasty with the bur, placed the superior cannula, and then used a bird beak suture anchor for the posterior fiber tape.  I then used the scorpion suture passer for the mid fiber tape, however with the first pass of the scorpion, the scarred rotator cuff was so thick that the needle of the scorpion tip bent on itself and fractured inside the tendon. This was not able to be retrieved.  I therefore made a nevaiser portal and then placed the central most portion of the fiber tape using the BirdBeak suture passer through nevaiser's portal.  I used  the scorpion suture passer with a new needle for the anterior limb of that fiber tape and also for the 2 FiberWire sutures.  I then placed an anchor into the posterior head using the posterior sutures, and  had excellent fixation. Through the same superior portal I placed an anchor anteriorly, tension the cuff, and had excellent apposition of the tendon to the bone. I was actually fairly surprised that the tendon mobility and her capacity to get the tendon back to the bone.  All the sutures were cut, final debridement carried out, I did not perform an acromioplasty to reduce the risk for future escape, and the portals were closed with Monocryl followed by Steri-Strips and sterile gauze. He was awakened and returned the PACU in stable and satisfactory condition. There were no complications with the exception of the fractured tip of the suture needle for the scorpion suture passer, and he tolerated the procedure well.

## 2014-07-25 NOTE — Anesthesia Preprocedure Evaluation (Signed)
Anesthesia Evaluation  Patient identified by MRN, date of birth, ID band Patient awake    Reviewed: Allergy & Precautions, NPO status , Patient's Chart, lab work & pertinent test results  Airway Mallampati: I  TM Distance: >3 FB Neck ROM: Full    Dental  (+) Teeth Intact, Dental Advisory Given   Pulmonary COPDCurrent Smoker,  breath sounds clear to auscultation        Cardiovascular Rhythm:Regular Rate:Normal     Neuro/Psych    GI/Hepatic GERD-  Medicated and Controlled,  Endo/Other    Renal/GU      Musculoskeletal   Abdominal   Peds  Hematology   Anesthesia Other Findings   Reproductive/Obstetrics                             Anesthesia Physical Anesthesia Plan  ASA: II  Anesthesia Plan: General   Post-op Pain Management:    Induction: Intravenous  Airway Management Planned: LMA  Additional Equipment:   Intra-op Plan:   Post-operative Plan: Extubation in OR  Informed Consent: I have reviewed the patients History and Physical, chart, labs and discussed the procedure including the risks, benefits and alternatives for the proposed anesthesia with the patient or authorized representative who has indicated his/her understanding and acceptance.   Dental advisory given  Plan Discussed with: CRNA, Anesthesiologist and Surgeon  Anesthesia Plan Comments:         Anesthesia Quick Evaluation

## 2014-07-25 NOTE — Transfer of Care (Signed)
Immediate Anesthesia Transfer of Care Note  Patient: Daniel Monroe  Procedure(s) Performed: Procedure(s): LEFT SHOULDER ARTHROSCOPY WITH EXTENSIVE DEBRIDEMENT,  ROTATOR CUFF REPAIR (Left)  Patient Location: PACU  Anesthesia Type:General  Level of Consciousness: awake and alert   Airway & Oxygen Therapy: Patient Spontanous Breathing and Patient connected to face mask oxygen  Post-op Assessment: Report given to RN and Post -op Vital signs reviewed and stable  Post vital signs: Reviewed and stable  Last Vitals:  Filed Vitals:   07/25/14 0835  BP: 129/81  Pulse: 96  Temp: 37 C  Resp: 22    Complications: No apparent anesthesia complications

## 2014-07-26 DIAGNOSIS — M75122 Complete rotator cuff tear or rupture of left shoulder, not specified as traumatic: Secondary | ICD-10-CM | POA: Diagnosis not present

## 2014-07-28 ENCOUNTER — Encounter (HOSPITAL_BASED_OUTPATIENT_CLINIC_OR_DEPARTMENT_OTHER): Payer: Self-pay | Admitting: Orthopedic Surgery

## 2017-06-20 HISTORY — PX: REVERSE TOTAL SHOULDER ARTHROPLASTY: SHX2344

## 2020-04-27 ENCOUNTER — Telehealth (HOSPITAL_COMMUNITY): Payer: Self-pay

## 2020-04-27 ENCOUNTER — Other Ambulatory Visit (HOSPITAL_COMMUNITY): Payer: Self-pay | Admitting: Family

## 2020-04-27 DIAGNOSIS — U071 COVID-19: Secondary | ICD-10-CM

## 2020-04-27 NOTE — Progress Notes (Signed)
I connected by phone with Daniel Monroe on 04/27/2020 at 7:18 PM to discuss the potential use of a new treatment for mild to moderate COVID-19 viral infection in non-hospitalized patients.  This patient is a 64 y.o. male that meets the FDA criteria for Emergency Use Authorization of COVID monoclonal antibody casirivimab/imdevimab, bamlanivimab/eteseviamb, or sotrovimab.  Has a (+) direct SARS-CoV-2 viral test result  Has mild or moderate COVID-19   Is NOT hospitalized due to COVID-19  Is within 10 days of symptom onset  Has at least one of the high risk factor(s) for progression to severe COVID-19 and/or hospitalization as defined in EUA.  Specific high risk criteria : Chronic Lung Disease   Symptoms of cough, SOB, and fever began 04/25/20.   I have spoken and communicated the following to the patient or parent/caregiver regarding COVID monoclonal antibody treatment:  1. FDA has authorized the emergency use for the treatment of mild to moderate COVID-19 in adults and pediatric patients with positive results of direct SARS-CoV-2 viral testing who are 77 years of age and older weighing at least 40 kg, and who are at high risk for progressing to severe COVID-19 and/or hospitalization.  2. The significant known and potential risks and benefits of COVID monoclonal antibody, and the extent to which such potential risks and benefits are unknown.  3. Information on available alternative treatments and the risks and benefits of those alternatives, including clinical trials.  4. Patients treated with COVID monoclonal antibody should continue to self-isolate and use infection control measures (e.g., wear mask, isolate, social distance, avoid sharing personal items, clean and disinfect "high touch" surfaces, and frequent handwashing) according to CDC guidelines.   5. The patient or parent/caregiver has the option to accept or refuse COVID monoclonal antibody treatment.  After reviewing this  information with the patient, the patient has agreed to receive one of the available covid 19 monoclonal antibodies and will be provided an appropriate fact sheet prior to infusion. [name], NP 04/27/2020 7:18 PM

## 2020-04-27 NOTE — Telephone Encounter (Signed)
Called to Discuss with patient about Covid symptoms and the use of the monoclonal antibody infusion for those with mild to moderate Covid symptoms and at a high risk of hospitalization.     Pt appears to qualify for this infusion due to co-morbid conditions and/or a member of an at-risk group in accordance with the FDA Emergency Use Authorization.    Pt stated his symptoms started on 11/6, tested positive on 11/8 at [facility] in [facility]. Pt stated he is experiencing cough, shortness of breath, and fever. 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 Pt instructed that an APP will call him back to verify information and set up an appointment if he qualifies.

## 2020-04-28 ENCOUNTER — Emergency Department (HOSPITAL_COMMUNITY)
Admission: EM | Admit: 2020-04-28 | Discharge: 2020-04-28 | Disposition: A | Discharge status: Home / Self Care | Payer: Medicare Other | Attending: Emergency Medicine | Admitting: Emergency Medicine

## 2020-04-28 ENCOUNTER — Ambulatory Visit (HOSPITAL_COMMUNITY)
Admission: RE | Admit: 2020-04-28 | Discharge: 2020-04-28 | Disposition: A | Discharge status: Home / Self Care | Payer: Medicare Other | Source: Ambulatory Visit | Attending: Pulmonary Disease | Admitting: Pulmonary Disease

## 2020-04-28 ENCOUNTER — Encounter (HOSPITAL_COMMUNITY): Payer: Self-pay

## 2020-04-28 ENCOUNTER — Emergency Department (HOSPITAL_COMMUNITY): Payer: Medicare Other

## 2020-04-28 ENCOUNTER — Other Ambulatory Visit (HOSPITAL_COMMUNITY): Payer: Self-pay | Admitting: General Acute Care Hospital

## 2020-04-28 ENCOUNTER — Other Ambulatory Visit: Payer: Self-pay

## 2020-04-28 DIAGNOSIS — U071 COVID-19: Secondary | ICD-10-CM | POA: Diagnosis not present

## 2020-04-28 DIAGNOSIS — J9601 Acute respiratory failure with hypoxia: Secondary | ICD-10-CM | POA: Diagnosis not present

## 2020-04-28 DIAGNOSIS — Z79899 Other long term (current) drug therapy: Secondary | ICD-10-CM | POA: Diagnosis not present

## 2020-04-28 DIAGNOSIS — J449 Chronic obstructive pulmonary disease, unspecified: Secondary | ICD-10-CM | POA: Insufficient documentation

## 2020-04-28 DIAGNOSIS — R0602 Shortness of breath: Secondary | ICD-10-CM | POA: Diagnosis present

## 2020-04-28 DIAGNOSIS — R0603 Acute respiratory distress: Secondary | ICD-10-CM

## 2020-04-28 DIAGNOSIS — Z87891 Personal history of nicotine dependence: Secondary | ICD-10-CM | POA: Diagnosis not present

## 2020-04-28 DIAGNOSIS — Z9981 Dependence on supplemental oxygen: Secondary | ICD-10-CM | POA: Diagnosis not present

## 2020-04-28 LAB — CBC WITH DIFFERENTIAL/PLATELET
Abs Immature Granulocytes: 0.02 10*3/uL (ref 0.00–0.07)
Basophils Absolute: 0 10*3/uL (ref 0.0–0.1)
Basophils Relative: 0 %
Eosinophils Absolute: 0 10*3/uL (ref 0.0–0.5)
Eosinophils Relative: 0 %
HCT: 36.9 % — ABNORMAL LOW (ref 39.0–52.0)
Hemoglobin: 12.8 g/dL — ABNORMAL LOW (ref 13.0–17.0)
Immature Granulocytes: 1 %
Lymphocytes Relative: 15 %
Lymphs Abs: 0.6 10*3/uL — ABNORMAL LOW (ref 0.7–4.0)
MCH: 32.2 pg (ref 26.0–34.0)
MCHC: 34.7 g/dL (ref 30.0–36.0)
MCV: 92.7 fL (ref 80.0–100.0)
Monocytes Absolute: 0.3 10*3/uL (ref 0.1–1.0)
Monocytes Relative: 7 %
Neutro Abs: 2.9 10*3/uL (ref 1.7–7.7)
Neutrophils Relative %: 77 %
Platelets: 141 10*3/uL — ABNORMAL LOW (ref 150–400)
RBC: 3.98 MIL/uL — ABNORMAL LOW (ref 4.22–5.81)
RDW: 13.1 % (ref 11.5–15.5)
WBC: 3.7 10*3/uL — ABNORMAL LOW (ref 4.0–10.5)
nRBC: 0 % (ref 0.0–0.2)

## 2020-04-28 LAB — COMPREHENSIVE METABOLIC PANEL
ALT: 26 U/L (ref 0–44)
AST: 33 U/L (ref 15–41)
Albumin: 3.3 g/dL — ABNORMAL LOW (ref 3.5–5.0)
Alkaline Phosphatase: 53 U/L (ref 38–126)
Anion gap: 11 (ref 5–15)
BUN: 10 mg/dL (ref 8–23)
CO2: 30 mmol/L (ref 22–32)
Calcium: 7.9 mg/dL — ABNORMAL LOW (ref 8.9–10.3)
Chloride: 85 mmol/L — ABNORMAL LOW (ref 98–111)
Creatinine, Ser: 0.7 mg/dL (ref 0.61–1.24)
GFR, Estimated: >60 mL/min (ref >60)
Glucose, Bld: 127 mg/dL — ABNORMAL HIGH (ref 70–99)
Potassium: 3.3 mmol/L — ABNORMAL LOW (ref 3.5–5.1)
Sodium: 126 mmol/L — ABNORMAL LOW (ref 135–145)
Total Bilirubin: 0.7 mg/dL (ref 0.3–1.2)
Total Protein: 7.1 g/dL (ref 6.5–8.1)

## 2020-04-28 LAB — FIBRINOGEN: Fibrinogen: 576 mg/dL — ABNORMAL HIGH (ref 210–475)

## 2020-04-28 LAB — FERRITIN: Ferritin: 694 ng/mL — ABNORMAL HIGH (ref 24–336)

## 2020-04-28 LAB — C-REACTIVE PROTEIN: CRP: 10.1 mg/dL — ABNORMAL HIGH (ref <1.0)

## 2020-04-28 LAB — TRIGLYCERIDES: Triglycerides: 75 mg/dL (ref <150)

## 2020-04-28 LAB — LACTIC ACID, PLASMA: Lactic Acid, Venous: 1.3 mmol/L (ref 0.5–1.9)

## 2020-04-28 LAB — LACTATE DEHYDROGENASE: LDH: 204 U/L — ABNORMAL HIGH (ref 98–192)

## 2020-04-28 LAB — D-DIMER, QUANTITATIVE: D-Dimer, Quant: 0.74 ug/mL-FEU — ABNORMAL HIGH (ref 0.00–0.50)

## 2020-04-28 LAB — PROCALCITONIN: Procalcitonin: 0.16 ng/mL

## 2020-04-28 MED ORDER — DEXAMETHASONE SODIUM PHOSPHATE 10 MG/ML IJ SOLN
10.0000 mg | Freq: Once | INTRAMUSCULAR | Status: AC
  Administered 2020-04-28: 10 mg via INTRAVENOUS
  Filled 2020-04-28: qty 1

## 2020-04-28 MED ORDER — SODIUM CHLORIDE 0.9 % IV BOLUS
500.0000 mL | Freq: Once | INTRAVENOUS | Status: AC
  Administered 2020-04-28: 500 mL via INTRAVENOUS

## 2020-04-28 NOTE — ED Notes (Addendum)
This RN spoke with patient's wife, phyllis. Requesting more updates at they become available. [phone]

## 2020-04-28 NOTE — Progress Notes (Signed)
1145 RN called [facility] in ED to request a bed. Currently no beds at this time, will call when bed is available.

## 2020-04-28 NOTE — ED Notes (Signed)
Please call wife, Jamesetta So

## 2020-04-28 NOTE — ED Provider Notes (Addendum)
[facility]-EMERGENCY DEPT Provider Note   CSN: [id number] Arrival date & time: 04/28/20  1504     History Chief Complaint  Patient presents with   Shortness of Breath    Daniel Monroe is a 64 y.o. male with pertinent past medical history of COPD on 3 L, tobacco use with recent diagnosis of Covid yesterday that presents emerge department today for shortness of breath.  Patient was at the infusion clinic getting the Mab infusion this morning, per infusion clinic RN upon ambulation his sats dropped to 87% on 3 L and patient was tachycardic.  Patient states that he has had increased work of breathing since Saturday when his symptoms began.  Infusion was not done.  Patient states that he has had to increase his oxygen at home to 4 to 5 L and he feels extremely short of breath on ambulation.  States that he has a cough and a fever at home.  Has not been taking anything for this.  States that [facility].  Denies any sick contacts.  Denies any chest pain.  Denies any numbness and tingling, myalgias, diarrhea.  States that he is generally healthy before this, has been compliant with his COPD medications.  HPI     Past Medical History:  Diagnosis Date   Back pain    Complete rotator cuff tear of left shoulder 07/25/2014   COPD (chronic obstructive pulmonary disease) (HCC)    on symbicort   Current smoker    1 ppd   GERD (gastroesophageal reflux disease)    Left rotator cuff tear    Rupture of biceps tendon    left   Shortness of breath    with exertion; pt says its due to smoking   URI (upper respiratory infection)     Patient Active Problem List   Diagnosis Date Noted   Complete rotator cuff tear of left shoulder 07/25/2014   Left rotator cuff tear 07/25/2014    Past Surgical History:  Procedure Laterality Date   HERNIA REPAIR     as a child   LUMBAR LAMINECTOMY/DECOMPRESSION MICRODISCECTOMY Right 01/07/2013    Procedure: LUMBAR FOUR LUMBAR FIVE LAMINECTOMY/DECOMPRESSION MICRODISCECTOMY 1 LEVEL;  Surgeon: Cristi LoronJeffrey D Jenkins, MD;  Location: [facility];  Service: Neurosurgery;  Laterality: Right;  RIGHT L45 microdiskectomy   ROTATOR CUFF REPAIR Left    SHOULDER ARTHROSCOPY WITH ROTATOR CUFF REPAIR Left 07/25/2014   Procedure: LEFT SHOULDER ARTHROSCOPY WITH EXTENSIVE DEBRIDEMENT,  ROTATOR CUFF REPAIR;  Surgeon: Eulas PostJoshua P Landau, MD;  Location: [facility];  Service: Orthopedics;  Laterality: Left;   TOTAL SHOULDER ARTHROPLASTY Right        No family history on file.  Social History   Tobacco Use   Smoking status: Former Smoker    Packs/day: 1.00    Years: 35.00    Pack years: 35.00    Types: Cigarettes   Smokeless tobacco: Former Forensic psychologistUser  Vaping Use   Vaping Use: Never used  Substance Use Topics   Alcohol use: Yes    Alcohol/week: 5.0 - 6.0 standard drinks    Types: 5 - 6 Cans of beer per week    Comment: Daily   Drug use: No    Home Medications Prior to Admission medications   Medication Sig Start Date End Date Taking? Authorizing Provider  ADVAIR DISKUS 250-50 MCG/DOSE AEPB Inhale 1 puff into the lungs 2 (two) times daily. 02/20/20   [provider]  azithromycin (ZITHROMAX) 250 MG tablet Take 250-500 mg by mouth See admin instructions. 500mg  on day 1 250mg  dya 2-5 04/27/20   [provider]  baclofen (LIORESAL) 10 MG tablet Take 1 tablet (10 mg total) by mouth 3 (three) times daily. As needed for muscle spasm 07/25/14   13/8/21, MD  budesonide-formoterol [facility]) 160-4.5 MCG/ACT inhaler Inhale 2 puffs into the lungs 2 (two) times daily.    [provider]  cholecalciferol (VITAMIN D) 25 MCG (1000 UNIT) tablet Take 1,000 Units by mouth daily. 03/02/20   [provider]  diazepam (VALIUM) 5 MG tablet Take 1 tablet (5 mg total) by mouth every 6 (six) hours as needed (muscle spasms). 01/08/13   03/04/20, MD  furosemide  (LASIX) 20 MG tablet Take 20 mg by mouth 3 (three) times daily. 01/01/20   [provider]  levofloxacin (LEVAQUIN) 750 MG tablet Take 750 mg by mouth daily. 10 day supply 04/27/20   [provider]  ondansetron (ZOFRAN) 4 MG tablet Take 1 tablet (4 mg total) by mouth every 8 (eight) hours as needed for nausea or vomiting. 07/25/14   13/8/21, MD  oxyCODONE-acetaminophen (PERCOCET) 10-325 MG per tablet Take 1-2 tablets by mouth every 6 (six) hours as needed for pain. MAXIMUM TOTAL ACETAMINOPHEN DOSE IS 4000 MG PER DAY 07/25/14   Teryl Lucy, MD  pantoprazole (PROTONIX) 40 MG tablet Take 40 mg by mouth 2 (two) times daily. 03/08/20   [provider]  predniSONE (STERAPRED UNI-PAK) 10 MG tablet Take by mouth daily.    [provider]  sennosides-docusate sodium (SENOKOT-S) 8.6-50 MG tablet Take 2 tablets by mouth daily. 07/25/14   09-26[birth year]1978, MD  Tetrahydrozoline HCl (VISINE OP) Apply 1-2 drops to eye 2 (two) times daily as needed. For dry eyes    [provider]    Allergies    Patient has no known allergies.  Review of Systems   Review of Systems  Constitutional: Positive for fever. Negative for chills, diaphoresis and fatigue.  HENT: Negative for congestion, sore throat and trouble swallowing.   Eyes: Negative for pain and visual disturbance.  Respiratory: Positive for cough and shortness of breath. Negative for chest tightness and wheezing.   Cardiovascular: Negative for chest pain, palpitations and leg swelling.  Gastrointestinal: Negative for abdominal distention, abdominal pain, diarrhea, nausea and vomiting.  Genitourinary: Negative for difficulty urinating.  Musculoskeletal: Negative for back pain, neck pain and neck stiffness.  Skin: Negative for pallor.  Neurological: Negative for dizziness, speech difficulty, weakness and headaches.  Psychiatric/Behavioral: Negative for confusion.    Physical Exam Updated Vital Signs BP 113/76     Pulse 99    Temp 99.1 F (37.3 C) (Oral)    Resp (!) 26    Ht 5\' 9"  (1.753 m)    Wt 95.7 kg    SpO2 97%    BMI 31.16 kg/m   Physical Exam Constitutional:      General: He is in acute distress.     Appearance: Normal appearance. He is not ill-appearing, toxic-appearing or diaphoretic.     Comments: Patient is currently on 5 L of oxygen and satting at 96%, increased work of breathing.  Patient is using accessory muscle use, is able to speak to me in full sentences.  HENT:     Mouth/Throat:     Mouth: Mucous membranes are moist.     Pharynx: Oropharynx is clear.  Eyes:     General: No scleral icterus.  Extraocular Movements: Extraocular movements intact.     Pupils: Pupils are equal, round, and reactive to light.  Cardiovascular:     Rate and Rhythm: Normal rate and regular rhythm.     Pulses: Normal pulses.     Heart sounds: Normal heart sounds.  Pulmonary:     Effort: Tachypnea and accessory muscle usage present. No respiratory distress.     Breath sounds: No stridor. Rhonchi and rales present. No wheezing.  Chest:     Chest wall: No tenderness.  Abdominal:     General: Abdomen is flat. There is no distension.     Palpations: Abdomen is soft.     Tenderness: There is no abdominal tenderness. There is no guarding or rebound.  Musculoskeletal:        General: No swelling or tenderness. Normal range of motion.     Cervical back: Normal range of motion and neck supple. No rigidity.     Right lower leg: No edema.     Left lower leg: No edema.  Skin:    General: Skin is warm and dry.     Capillary Refill: Capillary refill takes less than 2 seconds.     Coloration: Skin is not pale.  Neurological:     General: No focal deficit present.     Mental Status: He is alert and oriented to person, place, and time.     Cranial Nerves: No cranial nerve deficit.     Sensory: No sensory deficit.     Motor: No weakness.     Coordination: Coordination normal.  Psychiatric:        Mood  and Affect: Mood normal.        Behavior: Behavior normal.     ED Results / Procedures / Treatments   Labs (all labs ordered are listed, but only abnormal results are displayed) Labs Reviewed  CBC WITH DIFFERENTIAL/PLATELET - Abnormal; Notable for the following components:      Result Value   WBC 3.7 (*)    RBC 3.98 (*)    Hemoglobin 12.8 (*)    HCT 36.9 (*)    Platelets 141 (*)    Lymphs Abs 0.6 (*)    All other components within normal limits  COMPREHENSIVE METABOLIC PANEL - Abnormal; Notable for the following components:   Sodium 126 (*)    Potassium 3.3 (*)    Chloride 85 (*)    Glucose, Bld 127 (*)    Calcium 7.9 (*)    Albumin 3.3 (*)    All other components within normal limits  D-DIMER, QUANTITATIVE (NOT AT [facility]) - Abnormal; Notable for the following components:   D-Dimer, Quant 0.74 (*)    All other components within normal limits  LACTATE DEHYDROGENASE - Abnormal; Notable for the following components:   LDH 204 (*)    All other components within normal limits  FERRITIN - Abnormal; Notable for the following components:   Ferritin 694 (*)    All other components within normal limits  FIBRINOGEN - Abnormal; Notable for the following components:   Fibrinogen 576 (*)    All other components within normal limits  C-REACTIVE PROTEIN - Abnormal; Notable for the following components:   CRP 10.1 (*)    All other components within normal limits  LACTIC ACID, PLASMA  PROCALCITONIN  TRIGLYCERIDES  LACTIC ACID, PLASMA    EKG None  Radiology DG Chest Port 1 View  Result Date: 04/28/2020 CLINICAL DATA:  64 year old male with shortness of breath.  COPD.  EXAM: PORTABLE CHEST 1 VIEW COMPARISON:  Chest radiograph dated 06/03/2018 FINDINGS: Background of emphysema. No focal consolidation, pleural effusion, pneumothorax. There are bibasilar linear atelectasis/scarring. The cardiac silhouette is within limits. No acute osseous pathology. Atherosclerotic calcification of the  aorta. Bilateral shoulder arthroplasties. IMPRESSION: No active disease. Electronically Signed   By: Elgie Collard M.D.   On: 04/28/2020 16:54    Procedures .Critical Care Performed by: Farrel Gordon, PA-C Authorized by: Farrel Gordon, PA-C   Critical care provider statement:    Critical care time (minutes):  45   Critical care was necessary to treat or prevent imminent or life-threatening deterioration of the following conditions:  Respiratory failure   Critical care was time spent personally by me on the following activities:  Discussions with consultants, evaluation of patient's response to treatment, examination of patient, ordering and performing treatments and interventions, ordering and review of laboratory studies, ordering and review of radiographic studies, pulse oximetry, re-evaluation of patient's condition, obtaining history from patient or surrogate and review of old charts   (including critical care time)  Medications Ordered in ED Medications  dexamethasone (DECADRON) injection 10 mg (has no administration in time range)  sodium chloride 0.9 % bolus 500 mL (has no administration in time range)    ED Course  I have reviewed the triage vital signs and the nursing notes.  Pertinent labs & imaging results that were available during my care of the patient were reviewed by me and considered in my medical decision making (see chart for details).    MDM Rules/Calculators/A&P                         Patient is a 64 year old male with pertinent past medical history of COPD on 3 L that presents the emergency department today for shortness of breath after being diagnosed with Covid yesterday.  Patient appears acutely ill, increased work of breathing and tachypneic.  Patient is satting at 96% on 5 L at rest.  Covid labs ordered, Decadron given.  Work-up today with elevated inflammatory markers due to Covid, CMP remarkable for potassium of 3.3, sodium 126.  Patient appears dry,  will give 500 mL saline at this time.  Expressed to patient that he needs to come to the emergency department for increased shortness of breath with his Covid, this could escalate very quickly, patient states that he does not want to do this.  States that he wants to go home.  Did express to patient that he would be signing any AMA, risks discussed with pt including worsening decompensation and respiratory failure, pt expressed understanding, pt is competent at this time.   [facility] contacted by Dr. Rhunette Croft, [place] [facility] who will do intake.  Did discuss this with wife.  Patient to be discharged at this time.  Patient is leaving AMA.  I discussed this case with my attending physician who cosigned this note including patient's presenting symptoms, physical exam, and planned diagnostics and interventions. Attending physician stated agreement with plan or made changes to plan which were implemented.    Final Clinical Impression(s) / ED Diagnoses Final diagnoses:  Respiratory distress  COVID    Rx / DC Orders ED Discharge Orders    None        Farrel Gordon, PA-C 04/28/20 2119    Derwood Kaplan, MD 04/28/20 2333

## 2020-04-28 NOTE — ED Triage Notes (Signed)
Pt wheeled to ED from infusion clinic (IC). Per IC RN, pt uses 3L via nasal canula at baseline. Upon ambulation at IC, pt's sat dropped to 87% on 3L. Slightly tachycardic at 100 bpm in traige. Other VS WNL.

## 2020-04-28 NOTE — Discharge Instructions (Addendum)
You are leaving AGAINST MEDICAL ADVICE, please come back to the emergency department if you have any new or worsening concerning symptoms.  I did recommend for you to stay in the hospital due to your shortness of breath.  Please increase your oxygen, keep it at 95%.  Follow-up with the Covid clinic, hospital at home should be also contacting you.

## 2021-05-26 IMAGING — DX DG CHEST 1V PORT
1 series · 1 of 1 positions shown · non-contrast
Comparison: Chest radiograph dated 06/03/2018

CLINICAL DATA: 64-year-old male with shortness of breath.  COPD.

EXAM:
PORTABLE CHEST 1 VIEW

[chest ap]
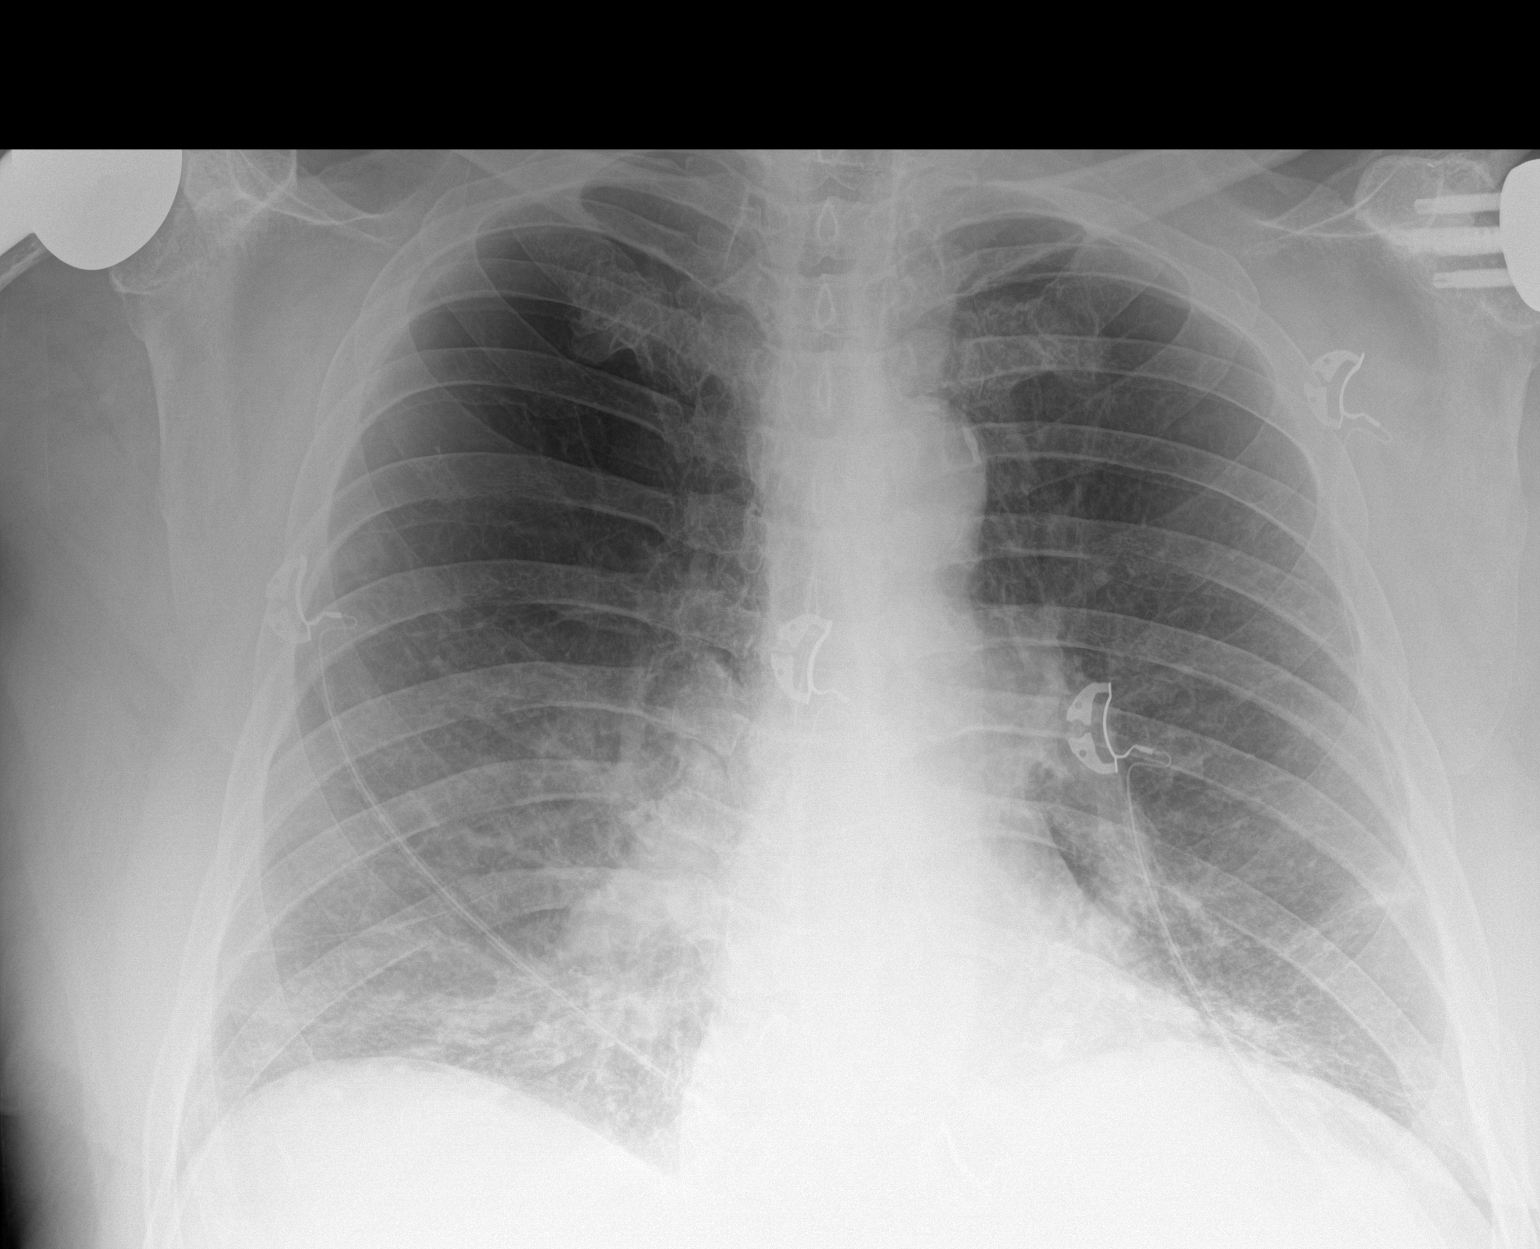

[1 of 1 positions shown; findings below may reference images not displayed]

FINDINGS: Background of emphysema. No focal consolidation, pleural effusion,
pneumothorax. There are bibasilar linear atelectasis/scarring. The
cardiac silhouette is within limits. No acute osseous pathology.
Atherosclerotic calcification of the aorta. Bilateral shoulder
arthroplasties.
IMPRESSION: No active disease.

## 2022-06-23 ENCOUNTER — Other Ambulatory Visit: Payer: Self-pay | Admitting: Neurosurgery

## 2022-06-23 DIAGNOSIS — M4807 Spinal stenosis, lumbosacral region: Secondary | ICD-10-CM

## 2022-07-13 ENCOUNTER — Ambulatory Visit
Admission: RE | Admit: 2022-07-13 | Discharge: 2022-07-13 | Disposition: A | Discharge status: Home / Self Care | Payer: Medicare Other | Source: Ambulatory Visit | Attending: Neurosurgery | Admitting: Neurosurgery

## 2022-07-13 DIAGNOSIS — M4807 Spinal stenosis, lumbosacral region: Secondary | ICD-10-CM

## 2022-07-13 MED ORDER — IOPAMIDOL (ISOVUE-M 200) INJECTION 41%
18.0000 mL | Freq: Once | INTRAMUSCULAR | Status: AC
  Administered 2022-07-13: 18 mL via INTRATHECAL

## 2022-07-13 MED ORDER — ONDANSETRON HCL 4 MG/2ML IJ SOLN
4.0000 mg | Freq: Once | INTRAMUSCULAR | Status: AC | PRN
  Administered 2022-07-13: 4 mg via INTRAMUSCULAR

## 2022-07-13 MED ORDER — DIAZEPAM 5 MG PO TABS
10.0000 mg | ORAL_TABLET | Freq: Once | ORAL | Status: AC
  Administered 2022-07-13: 5 mg via ORAL

## 2022-07-13 MED ORDER — MEPERIDINE HCL 50 MG/ML IJ SOLN
50.0000 mg | Freq: Once | INTRAMUSCULAR | Status: AC | PRN
  Administered 2022-07-13: 50 mg via INTRAMUSCULAR

## 2022-07-13 NOTE — Discharge Instructions (Signed)

## 2022-07-13 NOTE — Discharge Instr - Other Info (Signed)
1336: pt reports pain 7/10 from myelogram procedure. See [facility]

## 2022-12-28 ENCOUNTER — Other Ambulatory Visit: Payer: Self-pay | Admitting: Neurosurgery

## 2023-01-05 ENCOUNTER — Other Ambulatory Visit: Payer: Self-pay | Admitting: Neurosurgery

## 2023-01-27 NOTE — Pre-Procedure Instructions (Signed)
Surgical Instructions   Your procedure is scheduled on February 09, 2023. Report to [facility] Main Entrance "A" at 5:30 A.M., then check in with the Admitting office. Any questions or running late day of surgery: call [phone]  Questions prior to your surgery date: call [phone], Monday-Friday, 8am-4pm. If you experience any cold or flu symptoms such as cough, fever, chills, shortness of breath, etc. between now and your scheduled surgery, please notify us at the above number.     Remember:  Do not eat or drink after midnight the night before your surgery     Take these medicines the morning of surgery with A SIP OF WATER: ezetimibe (ZETIA)  fexofenadine (ALLEGRA)  mometasone-formoterol (DULERA) inhaler pantoprazole (PROTONIX)  Tiotropium Bromide Monohydrate inhaler   May take these medicines IF NEEDED: albuterol (VENTOLIN HFA) inhaler  Carboxymethylcellulose Sodium (THERATEARS) eye drops fluticasone (FLONASE) nasal spray    One week prior to surgery, STOP taking any Aspirin (unless otherwise instructed by your surgeon) Aleve, Naproxen, Ibuprofen, Motrin, Advil, Goody's, BC's, all herbal medications, fish oil, and non-prescription vitamins.                     Do NOT Smoke (Tobacco/Vaping) for 24 hours prior to your procedure.  If you use a CPAP at night, you may bring your mask/headgear for your overnight stay.   You will be asked to remove any contacts, glasses, piercing's, hearing aid's, dentures/partials prior to surgery. Please bring cases for these items if needed.    Patients discharged the day of surgery will not be allowed to drive home, and someone needs to stay with them for 24 hours.  SURGICAL WAITING ROOM VISITATION Patients may have no more than 2 support people in the waiting area - these visitors may rotate.   Pre-op nurse will coordinate an appropriate time for 1 ADULT support person, who may not rotate, to accompany patient in pre-op.  Children  under the age of 75 must have an adult with them who is not the patient and must remain in the main waiting area with an adult.  If the patient needs to stay at the hospital during part of their recovery, the visitor guidelines for inpatient rooms apply.  Please refer to the [facility] website for the visitor guidelines for any additional information.   If you received a COVID test during your pre-op visit  it is requested that you wear a mask when out in public, stay away from anyone that may not be feeling well and notify your surgeon if you develop symptoms. If you have been in contact with anyone that has tested positive in the last 10 days please notify you surgeon.      Pre-operative 5 CHG Bathing Instructions   You can play a key role in reducing the risk of infection after surgery. Your skin needs to be as free of germs as possible. You can reduce the number of germs on your skin by washing with CHG (chlorhexidine gluconate) soap before surgery. CHG is an antiseptic soap that kills germs and continues to kill germs even after washing.   DO NOT use if you have an allergy to chlorhexidine/CHG or antibacterial soaps. If your skin becomes reddened or irritated, stop using the CHG and notify one of our RNs at [phone].   Please shower with the CHG soap starting 4 days before surgery using the following schedule:     Please keep in mind the following:  DO NOT shave,  including legs and underarms, starting the day of your first shower.   You may shave your face at any point before/day of surgery.  Place clean sheets on your bed the day you start using CHG soap. Use a clean washcloth (not used since being washed) for each shower. DO NOT sleep with pets once you start using the CHG.   CHG Shower Instructions:  If you choose to wash your hair and private area, wash first with your normal shampoo/soap.  After you use shampoo/soap, rinse your hair and body thoroughly to remove  shampoo/soap residue.  Turn the water OFF and apply about 3 tablespoons (45 ml) of CHG soap to a CLEAN washcloth.  Apply CHG soap ONLY FROM YOUR NECK DOWN TO YOUR TOES (washing for 3-5 minutes)  DO NOT use CHG soap on face, private areas, open wounds, or sores.  Pay special attention to the area where your surgery is being performed.  If you are having back surgery, having someone wash your back for you may be helpful. Wait 2 minutes after CHG soap is applied, then you may rinse off the CHG soap.  Pat dry with a clean towel  Put on clean clothes/pajamas   If you choose to wear lotion, please use ONLY the CHG-compatible lotions on the back of this paper.   Additional instructions for the day of surgery: DO NOT APPLY any lotions, deodorants, cologne, or perfumes.   Do not bring valuables to the hospital. [facility] is not responsible for any belongings/valuables. Do not wear nail polish, gel polish, artificial nails, or any other type of covering on natural nails (fingers and toes) Do not wear jewelry or makeup Put on clean/comfortable clothes.  Please brush your teeth.  Ask your nurse before applying any prescription medications to the skin.     CHG Compatible Lotions   Aveeno Moisturizing lotion  Cetaphil Moisturizing Cream  Cetaphil Moisturizing Lotion  Clairol Herbal Essence Moisturizing Lotion, Dry Skin  Clairol Herbal Essence Moisturizing Lotion, Extra Dry Skin  Clairol Herbal Essence Moisturizing Lotion, Normal Skin  Curel Age Defying Therapeutic Moisturizing Lotion with Alpha Hydroxy  Curel Extreme Care Body Lotion  Curel Soothing Hands Moisturizing Hand Lotion  Curel Therapeutic Moisturizing Cream, Fragrance-Free  Curel Therapeutic Moisturizing Lotion, Fragrance-Free  Curel Therapeutic Moisturizing Lotion, Original Formula  Eucerin Daily Replenishing Lotion  Eucerin Dry Skin Therapy Plus Alpha Hydroxy Crme  Eucerin Dry Skin Therapy Plus Alpha Hydroxy Lotion  Eucerin  Original Crme  Eucerin Original Lotion  Eucerin Plus Crme Eucerin Plus Lotion  Eucerin TriLipid Replenishing Lotion  Keri Anti-Bacterial Hand Lotion  Keri Deep Conditioning Original Lotion Dry Skin Formula Softly Scented  Keri Deep Conditioning Original Lotion, Fragrance Free Sensitive Skin Formula  Keri Lotion Fast Absorbing Fragrance Free Sensitive Skin Formula  Keri Lotion Fast Absorbing Softly Scented Dry Skin Formula  Keri Original Lotion  Keri Skin Renewal Lotion Keri Silky Smooth Lotion  Keri Silky Smooth Sensitive Skin Lotion  Nivea Body Creamy Conditioning Oil  Nivea Body Extra Enriched Lotion  Nivea Body Original Lotion  Nivea Body Sheer Moisturizing Lotion Nivea Crme  Nivea Skin Firming Lotion  NutraDerm 30 Skin Lotion  NutraDerm Skin Lotion  NutraDerm Therapeutic Skin Cream  NutraDerm Therapeutic Skin Lotion  ProShield Protective Hand Cream  Provon moisturizing lotion  Please read over the following fact sheets that you were given.

## 2023-01-30 ENCOUNTER — Encounter (HOSPITAL_COMMUNITY): Payer: Self-pay

## 2023-01-30 ENCOUNTER — Encounter (HOSPITAL_COMMUNITY)
Admission: RE | Admit: 2023-01-30 | Discharge: 2023-01-30 | Disposition: A | Discharge status: Home / Self Care | Payer: No Typology Code available for payment source | Source: Ambulatory Visit | Attending: Neurosurgery | Admitting: Neurosurgery

## 2023-01-30 ENCOUNTER — Other Ambulatory Visit: Payer: Self-pay

## 2023-01-30 VITALS — BP 137/76 | HR 80 | Temp 98.0°F | Resp 18 | Ht 69.0 in | Wt 230.0 lb

## 2023-01-30 DIAGNOSIS — Z789 Other specified health status: Secondary | ICD-10-CM | POA: Diagnosis not present

## 2023-01-30 DIAGNOSIS — Z01818 Encounter for other preprocedural examination: Secondary | ICD-10-CM

## 2023-01-30 DIAGNOSIS — Z01812 Encounter for preprocedural laboratory examination: Secondary | ICD-10-CM | POA: Diagnosis not present

## 2023-01-30 DIAGNOSIS — F1090 Alcohol use, unspecified, uncomplicated: Secondary | ICD-10-CM

## 2023-01-30 DIAGNOSIS — Z01810 Encounter for preprocedural cardiovascular examination: Secondary | ICD-10-CM | POA: Diagnosis not present

## 2023-01-30 HISTORY — DX: Unspecified osteoarthritis, unspecified site: M19.90

## 2023-01-30 HISTORY — DX: Personal history of other diseases of the digestive system: Z87.19

## 2023-01-30 LAB — COMPREHENSIVE METABOLIC PANEL
ALT: 23 U/L (ref 0–44)
AST: 26 U/L (ref 15–41)
Albumin: 3.3 g/dL — ABNORMAL LOW (ref 3.5–5.0)
Alkaline Phosphatase: 59 U/L (ref 38–126)
Anion gap: 11 (ref 5–15)
BUN: 5 mg/dL — ABNORMAL LOW (ref 8–23)
CO2: 30 mmol/L (ref 22–32)
Calcium: 8.8 mg/dL — ABNORMAL LOW (ref 8.9–10.3)
Chloride: 95 mmol/L — ABNORMAL LOW (ref 98–111)
Creatinine, Ser: 0.67 mg/dL (ref 0.61–1.24)
GFR, Estimated: >60 mL/min (ref >60)
Glucose, Bld: 165 mg/dL — ABNORMAL HIGH (ref 70–99)
Potassium: 3.9 mmol/L (ref 3.5–5.1)
Sodium: 136 mmol/L (ref 135–145)
Total Bilirubin: 1.1 mg/dL (ref 0.3–1.2)
Total Protein: 6.6 g/dL (ref 6.5–8.1)

## 2023-01-30 LAB — CBC
HCT: 37.3 % — ABNORMAL LOW (ref 39.0–52.0)
Hemoglobin: 12.5 g/dL — ABNORMAL LOW (ref 13.0–17.0)
MCH: 31.2 pg (ref 26.0–34.0)
MCHC: 33.5 g/dL (ref 30.0–36.0)
MCV: 93 fL (ref 80.0–100.0)
Platelets: 287 10*3/uL (ref 150–400)
RBC: 4.01 MIL/uL — ABNORMAL LOW (ref 4.22–5.81)
RDW: 13.2 % (ref 11.5–15.5)
WBC: 6.2 10*3/uL (ref 4.0–10.5)
nRBC: 0 % (ref 0.0–0.2)

## 2023-01-30 LAB — TYPE AND SCREEN
ABO/RH(D): O POS
Antibody Screen: NEGATIVE

## 2023-01-30 LAB — SURGICAL PCR SCREEN
MRSA, PCR: NEGATIVE
Staphylococcus aureus: POSITIVE — AB

## 2023-01-30 NOTE — Progress Notes (Signed)
PCP - Daphane Shepherd, PA Cardiologist - denies Pulmonologist- Lysle Pearl  PPM/ICD - denies   Chest x-ray - 04/06/21 EKG - 01/30/23 Stress Test - 10/19/16 ECHO - 05/26/16 Cardiac Cath - 10/20/16  Sleep Study - denies   DM- denies  ASA/ Blood Thinner Instructions: n/a   ERAS Protcol - no, NPO   COVID TEST- n/a   Anesthesia review: yes, O2 continuous on 2L, has pulmonologist  Patient denies shortness of breath, fever, cough and chest pain at PAT appointment   All instructions explained to the patient, with a verbal understanding of the material. Patient agrees to go over the instructions while at home for a better understanding. The opportunity to ask questions was provided.

## 2023-01-31 NOTE — Progress Notes (Signed)
Anesthesia Chart Review:  Follows with pulmonology at [facility] for history of severe COPD with chronic respiratory failure maintained on 2 L continuous supplemental O2.  Last seen 11/01/2022, stable at that time, no recent exacerbations.  He was maintained on current medication regimen and recommended follow-up in 6 months.  History of hiatal hernia and GERD.  Labs reviewed, mild anemia hemoglobin 12.5, otherwise unremarkable.  EKG 01/30/2023: NSR.  Rate 78.  Cardiac cath 10/26/2016 (Care Everywhere): Conclusions  Diagnostic Procedure Summary  Normal coronary arteries.  Normal LV function      [facility] Stay [facility]/Anesthesiology Phone [phone] 01/31/2023 1:28 PM

## 2023-01-31 NOTE — Anesthesia Preprocedure Evaluation (Addendum)
Anesthesia Evaluation  Patient identified by MRN, date of birth, ID band Patient awake    Reviewed: Allergy & Precautions, NPO status , Patient's Chart, lab work & pertinent test results  Airway Mallampati: II  TM Distance: >3 FB Neck ROM: Full    Dental  (+) Dental Advisory Given   Pulmonary COPD,  COPD inhaler and oxygen dependent, former smoker   breath sounds clear to auscultation       Cardiovascular negative cardio ROS  Rhythm:Regular Rate:Normal     Neuro/Psych negative neurological ROS     GI/Hepatic Neg liver ROS, hiatal hernia,GERD  ,,  Endo/Other  negative endocrine ROS    Renal/GU negative Renal ROS     Musculoskeletal  (+) Arthritis ,    Abdominal   Peds  Hematology negative hematology ROS (+)   Anesthesia Other Findings   Reproductive/Obstetrics                             Anesthesia Physical Anesthesia Plan  ASA: 3  Anesthesia Plan: General   Post-op Pain Management: Tylenol PO (pre-op)*   Induction: Intravenous  PONV Risk Score and Plan: 2 and Dexamethasone, Ondansetron and Treatment may vary due to age or medical condition  Airway Management Planned: Oral ETT  Additional Equipment: None  Intra-op Plan:   Post-operative Plan: Extubation in OR  Informed Consent: I have reviewed the patients History and Physical, chart, labs and discussed the procedure including the risks, benefits and alternatives for the proposed anesthesia with the patient or authorized representative who has indicated his/her understanding and acceptance.     Dental advisory given  Plan Discussed with: CRNA  Anesthesia Plan Comments: (PAT note by Antionette Poles, PA-C: Follows with pulmonology at [facility] for history of severe COPD with chronic respiratory failure maintained on 2 L continuous supplemental O2.  Last seen 11/01/2022, stable at that time, no recent exacerbations.  He was  maintained on current medication regimen and recommended follow-up in 6 months.  History of hiatal hernia and GERD.  Labs reviewed, mild anemia hemoglobin 12.5, otherwise unremarkable.  EKG 01/30/2023: NSR.  Rate 78.  Cardiac cath 10/26/2016 (Care Everywhere): Conclusions  Diagnostic Procedure Summary  Normal coronary arteries.  Normal LV function   )        Anesthesia Quick Evaluation

## 2023-02-09 ENCOUNTER — Ambulatory Visit (HOSPITAL_COMMUNITY): Payer: No Typology Code available for payment source

## 2023-02-09 ENCOUNTER — Ambulatory Visit (HOSPITAL_COMMUNITY): Payer: No Typology Code available for payment source | Admitting: Physician Assistant

## 2023-02-09 ENCOUNTER — Other Ambulatory Visit: Payer: Self-pay

## 2023-02-09 ENCOUNTER — Ambulatory Visit (HOSPITAL_COMMUNITY): Payer: No Typology Code available for payment source | Admitting: Anesthesiology

## 2023-02-09 ENCOUNTER — Ambulatory Visit (HOSPITAL_COMMUNITY)
Admission: RE | Disposition: A | Discharge status: Home / Self Care | Payer: Self-pay | Source: Home / Self Care | Attending: Neurosurgery

## 2023-02-09 ENCOUNTER — Ambulatory Visit (HOSPITAL_COMMUNITY)
Admission: RE | Admit: 2023-02-09 | Discharge: 2023-02-10 | Disposition: A | Discharge status: Home / Self Care | Payer: No Typology Code available for payment source | Attending: Neurosurgery | Admitting: Neurosurgery

## 2023-02-09 ENCOUNTER — Encounter (HOSPITAL_COMMUNITY): Payer: Self-pay | Admitting: Neurosurgery

## 2023-02-09 DIAGNOSIS — M1990 Unspecified osteoarthritis, unspecified site: Secondary | ICD-10-CM | POA: Insufficient documentation

## 2023-02-09 DIAGNOSIS — Z7951 Long term (current) use of inhaled steroids: Secondary | ICD-10-CM | POA: Insufficient documentation

## 2023-02-09 DIAGNOSIS — M48062 Spinal stenosis, lumbar region with neurogenic claudication: Secondary | ICD-10-CM | POA: Insufficient documentation

## 2023-02-09 DIAGNOSIS — M5116 Intervertebral disc disorders with radiculopathy, lumbar region: Secondary | ICD-10-CM | POA: Insufficient documentation

## 2023-02-09 DIAGNOSIS — Z87891 Personal history of nicotine dependence: Secondary | ICD-10-CM | POA: Insufficient documentation

## 2023-02-09 DIAGNOSIS — M4807 Spinal stenosis, lumbosacral region: Secondary | ICD-10-CM | POA: Diagnosis not present

## 2023-02-09 DIAGNOSIS — J449 Chronic obstructive pulmonary disease, unspecified: Secondary | ICD-10-CM | POA: Insufficient documentation

## 2023-02-09 DIAGNOSIS — K219 Gastro-esophageal reflux disease without esophagitis: Secondary | ICD-10-CM | POA: Diagnosis not present

## 2023-02-09 DIAGNOSIS — M4317 Spondylolisthesis, lumbosacral region: Secondary | ICD-10-CM | POA: Diagnosis present

## 2023-02-09 LAB — ABO/RH: ABO/RH(D): O POS

## 2023-02-09 SURGERY — POSTERIOR LUMBAR FUSION 1 LEVEL
Anesthesia: General | Site: Spine Lumbar

## 2023-02-09 MED ORDER — PROPOFOL 10 MG/ML IV BOLUS
INTRAVENOUS | Status: DC | PRN
  Administered 2023-02-09: 160 mg via INTRAVENOUS

## 2023-02-09 MED ORDER — MONTELUKAST SODIUM 10 MG PO TABS
10.0000 mg | ORAL_TABLET | Freq: Every day | ORAL | Status: DC
  Administered 2023-02-09: 10 mg via ORAL
  Filled 2023-02-09: qty 1

## 2023-02-09 MED ORDER — PHENYLEPHRINE HCL-NACL 20-0.9 MG/250ML-% IV SOLN
INTRAVENOUS | Status: DC | PRN
Start: 2023-02-09 — End: 2023-02-09
  Administered 2023-02-09: 20 ug/min via INTRAVENOUS

## 2023-02-09 MED ORDER — GUAIFENESIN ER 600 MG PO TB12
1200.0000 mg | ORAL_TABLET | Freq: Two times a day (BID) | ORAL | Status: DC
  Administered 2023-02-09 (×2): 1200 mg via ORAL
  Filled 2023-02-09 (×3): qty 2

## 2023-02-09 MED ORDER — ALBUTEROL SULFATE (2.5 MG/3ML) 0.083% IN NEBU: 3.0000 mL | INHALATION_SOLUTION | RESPIRATORY_TRACT | Status: DC | PRN

## 2023-02-09 MED ORDER — MOMETASONE FURO-FORMOTEROL FUM 200-5 MCG/ACT IN AERO
2.0000 | INHALATION_SPRAY | Freq: Two times a day (BID) | RESPIRATORY_TRACT | Status: DC
  Administered 2023-02-09 – 2023-02-10 (×2): 2 via RESPIRATORY_TRACT
  Filled 2023-02-09: qty 8.8

## 2023-02-09 MED ORDER — ACETAMINOPHEN 500 MG PO TABS
1000.0000 mg | ORAL_TABLET | Freq: Four times a day (QID) | ORAL | Status: DC
  Administered 2023-02-09 – 2023-02-10 (×3): 1000 mg via ORAL
  Filled 2023-02-09 (×4): qty 2

## 2023-02-09 MED ORDER — MORPHINE SULFATE (PF) 4 MG/ML IV SOLN: 4.0000 mg | INTRAVENOUS | Status: DC | PRN

## 2023-02-09 MED ORDER — MIDAZOLAM HCL 2 MG/2ML IJ SOLN
INTRAMUSCULAR | Status: DC | PRN
  Administered 2023-02-09: 1 mg via INTRAVENOUS

## 2023-02-09 MED ORDER — CYCLOBENZAPRINE HCL 10 MG PO TABS
10.0000 mg | ORAL_TABLET | Freq: Three times a day (TID) | ORAL | Status: DC | PRN
  Administered 2023-02-09 – 2023-02-10 (×3): 10 mg via ORAL
  Filled 2023-02-09 (×3): qty 1

## 2023-02-09 MED ORDER — ONDANSETRON HCL 4 MG/2ML IJ SOLN: 4.0000 mg | Freq: Four times a day (QID) | INTRAMUSCULAR | Status: DC | PRN

## 2023-02-09 MED ORDER — HYDROMORPHONE HCL 1 MG/ML IJ SOLN
INTRAMUSCULAR | Status: AC
  Filled 2023-02-09: qty 0.5

## 2023-02-09 MED ORDER — FENTANYL CITRATE (PF) 250 MCG/5ML IJ SOLN
INTRAMUSCULAR | Status: AC
  Filled 2023-02-09: qty 5

## 2023-02-09 MED ORDER — LACTATED RINGERS IV SOLN: INTRAVENOUS | Status: DC

## 2023-02-09 MED ORDER — BUPIVACAINE-EPINEPHRINE (PF) 0.5% -1:200000 IJ SOLN
INTRAMUSCULAR | Status: AC
  Filled 2023-02-09: qty 30

## 2023-02-09 MED ORDER — ACETAMINOPHEN 325 MG PO TABS: 650.0000 mg | ORAL_TABLET | ORAL | Status: DC | PRN

## 2023-02-09 MED ORDER — EZETIMIBE 10 MG PO TABS
10.0000 mg | ORAL_TABLET | Freq: Every day | ORAL | Status: DC
  Administered 2023-02-09: 10 mg via ORAL
  Filled 2023-02-09 (×2): qty 1

## 2023-02-09 MED ORDER — ACETAMINOPHEN 10 MG/ML IV SOLN
INTRAVENOUS | Status: AC
  Filled 2023-02-09: qty 100

## 2023-02-09 MED ORDER — FENTANYL CITRATE (PF) 100 MCG/2ML IJ SOLN
25.0000 ug | INTRAMUSCULAR | Status: DC | PRN
  Administered 2023-02-09: 25 ug via INTRAVENOUS

## 2023-02-09 MED ORDER — PROPOFOL 10 MG/ML IV BOLUS
INTRAVENOUS | Status: AC
  Filled 2023-02-09: qty 20

## 2023-02-09 MED ORDER — DEXAMETHASONE SODIUM PHOSPHATE 10 MG/ML IJ SOLN
INTRAMUSCULAR | Status: DC | PRN
  Administered 2023-02-09: 10 mg via INTRAVENOUS

## 2023-02-09 MED ORDER — PANTOPRAZOLE SODIUM 40 MG PO TBEC
40.0000 mg | DELAYED_RELEASE_TABLET | Freq: Two times a day (BID) | ORAL | Status: DC
  Administered 2023-02-09: 40 mg via ORAL
  Filled 2023-02-09 (×2): qty 1

## 2023-02-09 MED ORDER — POTASSIUM CHLORIDE CRYS ER 20 MEQ PO TBCR
20.0000 meq | EXTENDED_RELEASE_TABLET | Freq: Every day | ORAL | Status: DC
  Administered 2023-02-09: 20 meq via ORAL
  Filled 2023-02-09 (×2): qty 1

## 2023-02-09 MED ORDER — THROMBIN 5000 UNITS EX SOLR: OROMUCOSAL | Status: DC | PRN

## 2023-02-09 MED ORDER — MENTHOL 3 MG MT LOZG: 1.0000 | LOZENGE | OROMUCOSAL | Status: DC | PRN

## 2023-02-09 MED ORDER — BISACODYL 10 MG RE SUPP: 10.0000 mg | Freq: Every day | RECTAL | Status: DC | PRN

## 2023-02-09 MED ORDER — ONDANSETRON HCL 4 MG/2ML IJ SOLN
INTRAMUSCULAR | Status: DC | PRN
Start: 2023-02-09 — End: 2023-02-09
  Administered 2023-02-09: 4 mg via INTRAVENOUS

## 2023-02-09 MED ORDER — SODIUM CHLORIDE 0.9% FLUSH: 3.0000 mL | INTRAVENOUS | Status: DC | PRN

## 2023-02-09 MED ORDER — BACITRACIN ZINC 500 UNIT/GM EX OINT
TOPICAL_OINTMENT | CUTANEOUS | Status: DC | PRN
  Administered 2023-02-09: 1 via TOPICAL

## 2023-02-09 MED ORDER — OXYCODONE HCL 5 MG PO TABS
5.0000 mg | ORAL_TABLET | ORAL | Status: DC | PRN
  Administered 2023-02-09: 5 mg via ORAL
  Filled 2023-02-09: qty 1

## 2023-02-09 MED ORDER — FENTANYL CITRATE (PF) 100 MCG/2ML IJ SOLN
INTRAMUSCULAR | Status: AC
  Filled 2023-02-09: qty 2

## 2023-02-09 MED ORDER — UMECLIDINIUM BROMIDE 62.5 MCG/ACT IN AEPB
1.0000 | INHALATION_SPRAY | Freq: Every day | RESPIRATORY_TRACT | Status: DC
  Administered 2023-02-10: 1 via RESPIRATORY_TRACT
  Filled 2023-02-09: qty 7

## 2023-02-09 MED ORDER — OXYCODONE HCL 5 MG/5ML PO SOLN
ORAL | Status: AC
  Filled 2023-02-09: qty 5

## 2023-02-09 MED ORDER — BUPIVACAINE LIPOSOME 1.3 % IJ SUSP
INTRAMUSCULAR | Status: DC | PRN
  Administered 2023-02-09: 20 mL

## 2023-02-09 MED ORDER — ACETAMINOPHEN 650 MG RE SUPP: 650.0000 mg | RECTAL | Status: DC | PRN

## 2023-02-09 MED ORDER — FENTANYL CITRATE (PF) 250 MCG/5ML IJ SOLN
INTRAMUSCULAR | Status: DC | PRN
  Administered 2023-02-09 (×10): 50 ug via INTRAVENOUS

## 2023-02-09 MED ORDER — BUPIVACAINE-EPINEPHRINE (PF) 0.5% -1:200000 IJ SOLN
INTRAMUSCULAR | Status: DC | PRN
  Administered 2023-02-09: 10 mL

## 2023-02-09 MED ORDER — CHLORHEXIDINE GLUCONATE 0.12 % MT SOLN
OROMUCOSAL | Status: AC
  Administered 2023-02-09: 15 mL via OROMUCOSAL
  Filled 2023-02-09: qty 15

## 2023-02-09 MED ORDER — VITAMIN D 25 MCG (1000 UNIT) PO TABS
1000.0000 [IU] | ORAL_TABLET | Freq: Every day | ORAL | Status: DC
  Administered 2023-02-09: 1000 [IU] via ORAL
  Filled 2023-02-09 (×2): qty 1

## 2023-02-09 MED ORDER — SURGIPHOR WOUND IRRIGATION SYSTEM - OPTIME: TOPICAL | Status: DC | PRN

## 2023-02-09 MED ORDER — SUGAMMADEX SODIUM 200 MG/2ML IV SOLN
INTRAVENOUS | Status: DC | PRN
  Administered 2023-02-09: 200 mg via INTRAVENOUS

## 2023-02-09 MED ORDER — FUROSEMIDE 20 MG PO TABS
30.0000 mg | ORAL_TABLET | Freq: Two times a day (BID) | ORAL | Status: DC
  Administered 2023-02-09 – 2023-02-10 (×2): 30 mg via ORAL
  Filled 2023-02-09 (×2): qty 2

## 2023-02-09 MED ORDER — OXYCODONE HCL 5 MG PO TABS
10.0000 mg | ORAL_TABLET | ORAL | Status: DC | PRN
  Administered 2023-02-09 – 2023-02-10 (×5): 10 mg via ORAL
  Filled 2023-02-09 (×5): qty 2

## 2023-02-09 MED ORDER — ONDANSETRON HCL 4 MG PO TABS: 4.0000 mg | ORAL_TABLET | Freq: Four times a day (QID) | ORAL | Status: DC | PRN

## 2023-02-09 MED ORDER — ACETAMINOPHEN 500 MG PO TABS: 1000.0000 mg | ORAL_TABLET | Freq: Once | ORAL | Status: AC

## 2023-02-09 MED ORDER — THROMBIN 5000 UNITS EX SOLR
CUTANEOUS | Status: AC
  Filled 2023-02-09: qty 5000

## 2023-02-09 MED ORDER — LORATADINE 10 MG PO TABS
10.0000 mg | ORAL_TABLET | Freq: Every day | ORAL | Status: DC
  Administered 2023-02-09: 10 mg via ORAL
  Filled 2023-02-09 (×2): qty 1

## 2023-02-09 MED ORDER — 0.9 % SODIUM CHLORIDE (POUR BTL) OPTIME
TOPICAL | Status: DC | PRN
  Administered 2023-02-09 (×2): 1000 mL

## 2023-02-09 MED ORDER — CHLORHEXIDINE GLUCONATE CLOTH 2 % EX PADS: 6.0000 | MEDICATED_PAD | Freq: Once | CUTANEOUS | Status: DC

## 2023-02-09 MED ORDER — ACETAMINOPHEN 500 MG PO TABS
ORAL_TABLET | ORAL | Status: AC
  Administered 2023-02-09: 1000 mg via ORAL
  Filled 2023-02-09: qty 2

## 2023-02-09 MED ORDER — DOCUSATE SODIUM 100 MG PO CAPS
100.0000 mg | ORAL_CAPSULE | Freq: Two times a day (BID) | ORAL | Status: DC
  Administered 2023-02-09 (×2): 100 mg via ORAL
  Filled 2023-02-09 (×3): qty 1

## 2023-02-09 MED ORDER — ORAL CARE MOUTH RINSE: 15.0000 mL | Freq: Once | OROMUCOSAL | Status: AC

## 2023-02-09 MED ORDER — CEFAZOLIN SODIUM-DEXTROSE 2-4 GM/100ML-% IV SOLN
2.0000 g | INTRAVENOUS | Status: AC
  Administered 2023-02-09: 2 g via INTRAVENOUS

## 2023-02-09 MED ORDER — CEFAZOLIN SODIUM-DEXTROSE 2-4 GM/100ML-% IV SOLN
INTRAVENOUS | Status: AC
  Filled 2023-02-09: qty 100

## 2023-02-09 MED ORDER — KETAMINE HCL 10 MG/ML IJ SOLN
INTRAMUSCULAR | Status: DC | PRN
  Administered 2023-02-09: 20 mg via INTRAVENOUS

## 2023-02-09 MED ORDER — ALBUMIN HUMAN 5 % IV SOLN
INTRAVENOUS | Status: DC | PRN
Start: 2023-02-09 — End: 2023-02-09

## 2023-02-09 MED ORDER — ACETAMINOPHEN 10 MG/ML IV SOLN
1000.0000 mg | Freq: Once | INTRAVENOUS | Status: AC
  Administered 2023-02-09: 1000 mg via INTRAVENOUS

## 2023-02-09 MED ORDER — MUPIROCIN 2 % EX OINT
1.0000 | TOPICAL_OINTMENT | Freq: Two times a day (BID) | CUTANEOUS | Status: DC
  Administered 2023-02-09 – 2023-02-10 (×2): 1 via NASAL
  Filled 2023-02-09 (×2): qty 22

## 2023-02-09 MED ORDER — BUPIVACAINE LIPOSOME 1.3 % IJ SUSP
INTRAMUSCULAR | Status: AC
  Filled 2023-02-09: qty 20

## 2023-02-09 MED ORDER — PHENOL 1.4 % MT LIQD: 1.0000 | OROMUCOSAL | Status: DC | PRN

## 2023-02-09 MED ORDER — AMISULPRIDE (ANTIEMETIC) 5 MG/2ML IV SOLN: 10.0000 mg | Freq: Once | INTRAVENOUS | Status: DC | PRN

## 2023-02-09 MED ORDER — CEFAZOLIN SODIUM-DEXTROSE 2-4 GM/100ML-% IV SOLN
2.0000 g | Freq: Three times a day (TID) | INTRAVENOUS | Status: AC
  Administered 2023-02-09 (×2): 2 g via INTRAVENOUS
  Filled 2023-02-09 (×2): qty 100

## 2023-02-09 MED ORDER — TIOTROPIUM BROMIDE MONOHYDRATE 2.5 MCG/ACT IN AERS: 2.0000 | INHALATION_SPRAY | Freq: Every day | RESPIRATORY_TRACT | Status: DC

## 2023-02-09 MED ORDER — SODIUM CHLORIDE 0.9 % IV SOLN
250.0000 mL | INTRAVENOUS | Status: DC
  Administered 2023-02-09: 250 mL via INTRAVENOUS

## 2023-02-09 MED ORDER — HYDROMORPHONE HCL 1 MG/ML IJ SOLN
INTRAMUSCULAR | Status: DC | PRN
  Administered 2023-02-09: .5 mg via INTRAVENOUS

## 2023-02-09 MED ORDER — BACITRACIN ZINC 500 UNIT/GM EX OINT
TOPICAL_OINTMENT | CUTANEOUS | Status: AC
  Filled 2023-02-09: qty 28.35

## 2023-02-09 MED ORDER — KETOROLAC TROMETHAMINE 30 MG/ML IJ SOLN
INTRAMUSCULAR | Status: DC | PRN
Start: 2023-02-09 — End: 2023-02-09
  Administered 2023-02-09: 30 mg via INTRAVENOUS

## 2023-02-09 MED ORDER — KETAMINE HCL 50 MG/5ML IJ SOSY
PREFILLED_SYRINGE | INTRAMUSCULAR | Status: AC
  Filled 2023-02-09: qty 5

## 2023-02-09 MED ORDER — PHENYLEPHRINE 80 MCG/ML (10ML) SYRINGE FOR IV PUSH (FOR BLOOD PRESSURE SUPPORT)
PREFILLED_SYRINGE | INTRAVENOUS | Status: DC | PRN
  Administered 2023-02-09 (×2): 160 ug via INTRAVENOUS

## 2023-02-09 MED ORDER — SODIUM CHLORIDE 0.9% FLUSH
3.0000 mL | Freq: Two times a day (BID) | INTRAVENOUS | Status: DC
  Administered 2023-02-09: 3 mL via INTRAVENOUS

## 2023-02-09 MED ORDER — LIDOCAINE 2% (20 MG/ML) 5 ML SYRINGE
INTRAMUSCULAR | Status: DC | PRN
  Administered 2023-02-09: 60 mg via INTRAVENOUS

## 2023-02-09 MED ORDER — OXYCODONE HCL 5 MG/5ML PO SOLN
5.0000 mg | ORAL | Status: DC | PRN
  Administered 2023-02-09: 5 mg via ORAL

## 2023-02-09 MED ORDER — FLUTICASONE PROPIONATE 50 MCG/ACT NA SUSP: 1.0000 | Freq: Every day | NASAL | Status: DC | PRN

## 2023-02-09 MED ORDER — CHLORHEXIDINE GLUCONATE 0.12 % MT SOLN: 15.0000 mL | Freq: Once | OROMUCOSAL | Status: AC

## 2023-02-09 MED ORDER — MIDAZOLAM HCL 2 MG/2ML IJ SOLN
INTRAMUSCULAR | Status: AC
  Filled 2023-02-09: qty 2

## 2023-02-09 MED ORDER — ROCURONIUM BROMIDE 10 MG/ML (PF) SYRINGE
PREFILLED_SYRINGE | INTRAVENOUS | Status: DC | PRN
  Administered 2023-02-09 (×3): 10 mg via INTRAVENOUS
  Administered 2023-02-09: 20 mg via INTRAVENOUS
  Administered 2023-02-09 (×2): 10 mg via INTRAVENOUS
  Administered 2023-02-09: 60 mg via INTRAVENOUS
  Administered 2023-02-09: 10 mg via INTRAVENOUS
  Administered 2023-02-09: 20 mg via INTRAVENOUS
  Administered 2023-02-09 (×2): 10 mg via INTRAVENOUS

## 2023-02-09 SURGICAL SUPPLY — 67 items
APL SKNCLS STERI-STRIP NONHPOA (GAUZE/BANDAGES/DRESSINGS) ×1
BAG COUNTER SPONGE SURGICOUNT (BAG) ×2 IMPLANT
BAG SPNG CNTER NS LX DISP (BAG) ×1
BASKET BONE COLLECTION (BASKET) ×2 IMPLANT
BENZOIN TINCTURE PRP APPL 2/3 (GAUZE/BANDAGES/DRESSINGS) ×2 IMPLANT
BLADE CLIPPER SURG (BLADE) IMPLANT
BUR MATCHSTICK NEURO 3.0 LAGG (BURR) ×2 IMPLANT
BUR PRECISION FLUTE 6.0 (BURR) ×2 IMPLANT
CAGE ALTERA 10X31X9-13 15D (Cage) IMPLANT
CANISTER SUCT 3000ML PPV (MISCELLANEOUS) ×2 IMPLANT
CAP LOCK DLX THRD (Cap) IMPLANT
CNTNR URN SCR LID CUP LEK RST (MISCELLANEOUS) ×2 IMPLANT
CONT SPEC 4OZ STRL OR WHT (MISCELLANEOUS) ×1
COVER BACK TABLE 60X90IN (DRAPES) ×2 IMPLANT
DRAPE C-ARM 42X72 X-RAY (DRAPES) ×4 IMPLANT
DRAPE HALF SHEET 40X57 (DRAPES) ×2 IMPLANT
DRAPE LAPAROTOMY 100X72X124 (DRAPES) ×2 IMPLANT
DRAPE SURG 17X23 STRL (DRAPES) ×8 IMPLANT
DRSG OPSITE POSTOP 4X6 (GAUZE/BANDAGES/DRESSINGS) ×2 IMPLANT
DRSG OPSITE POSTOP 4X8 (GAUZE/BANDAGES/DRESSINGS) IMPLANT
ELECT BLADE 4.0 EZ CLEAN MEGAD (MISCELLANEOUS) ×1
ELECT REM PT RETURN 9FT ADLT (ELECTROSURGICAL) ×1
ELECTRODE BLDE 4.0 EZ CLN MEGD (MISCELLANEOUS) ×2 IMPLANT
ELECTRODE REM PT RTRN 9FT ADLT (ELECTROSURGICAL) ×2 IMPLANT
EVACUATOR 1/8 PVC DRAIN (DRAIN) IMPLANT
GAUZE 4X4 16PLY ~~LOC~~+RFID DBL (SPONGE) ×2 IMPLANT
GLOVE BIO SURGEON STRL SZ 6 (GLOVE) ×2 IMPLANT
GLOVE BIO SURGEON STRL SZ8 (GLOVE) ×4 IMPLANT
GLOVE BIO SURGEON STRL SZ8.5 (GLOVE) ×4 IMPLANT
GLOVE BIOGEL PI IND STRL 6.5 (GLOVE) ×2 IMPLANT
GOWN STRL REUS W/ TWL LRG LVL3 (GOWN DISPOSABLE) ×2 IMPLANT
GOWN STRL REUS W/ TWL XL LVL3 (GOWN DISPOSABLE) ×4 IMPLANT
GOWN STRL REUS W/TWL 2XL LVL3 (GOWN DISPOSABLE) IMPLANT
GOWN STRL REUS W/TWL LRG LVL3 (GOWN DISPOSABLE) ×1
GOWN STRL REUS W/TWL XL LVL3 (GOWN DISPOSABLE) ×2
HEMOSTAT POWDER KIT SURGIFOAM (HEMOSTASIS) ×2 IMPLANT
KIT BASIN OR (CUSTOM PROCEDURE TRAY) ×2 IMPLANT
KIT GRAFTMAG DEL NEURO DISP (NEUROSURGERY SUPPLIES) IMPLANT
KIT POSITION SURG JACKSON T1 (MISCELLANEOUS) ×2 IMPLANT
KIT TURNOVER KIT B (KITS) ×2 IMPLANT
NDL HYPO 21X1.5 SAFETY (NEEDLE) IMPLANT
NDL HYPO 22X1.5 SAFETY MO (MISCELLANEOUS) ×2 IMPLANT
NEEDLE HYPO 21X1.5 SAFETY (NEEDLE) IMPLANT
NEEDLE HYPO 22X1.5 SAFETY MO (MISCELLANEOUS) ×1 IMPLANT
NS IRRIG 1000ML POUR BTL (IV SOLUTION) ×2 IMPLANT
PACK LAMINECTOMY NEURO (CUSTOM PROCEDURE TRAY) ×2 IMPLANT
PAD ARMBOARD 7.5X6 YLW CONV (MISCELLANEOUS) ×6 IMPLANT
PATTIES SURGICAL .5 X1 (DISPOSABLE) IMPLANT
PUTTY DBM 10CC CALC GRAN (Putty) IMPLANT
SCREW PA CREO DLX 6.5X55 (Screw) IMPLANT
SCREW PA DLX CREO 7.5X50 (Screw) IMPLANT
SCREW PA DLX CREO 7.5X55 (Screw) IMPLANT
SOLUTION IRRIG SURGIPHOR (IV SOLUTION) ×2 IMPLANT
SPIKE FLUID TRANSFER (MISCELLANEOUS) ×2 IMPLANT
SPONGE NEURO XRAY DETECT 1X3 (DISPOSABLE) IMPLANT
SPONGE SURGIFOAM ABS GEL 100 (HEMOSTASIS) IMPLANT
SPONGE T-LAP 4X18 ~~LOC~~+RFID (SPONGE) IMPLANT
STRIP CLOSURE SKIN 1/2X4 (GAUZE/BANDAGES/DRESSINGS) ×2 IMPLANT
SUT ETHILON 2 0 PSLX (SUTURE) IMPLANT
SUT VIC AB 1 CT1 18XBRD ANBCTR (SUTURE) ×4 IMPLANT
SUT VIC AB 1 CT1 8-18 (SUTURE) ×2
SUT VIC AB 2-0 CP2 18 (SUTURE) ×4 IMPLANT
SYR 20ML LL LF (SYRINGE) IMPLANT
TOWEL GREEN STERILE (TOWEL DISPOSABLE) ×2 IMPLANT
TOWEL GREEN STERILE FF (TOWEL DISPOSABLE) ×2 IMPLANT
TRAY FOLEY MTR SLVR 16FR STAT (SET/KITS/TRAYS/PACK) ×2 IMPLANT
WATER STERILE IRR 1000ML POUR (IV SOLUTION) ×2 IMPLANT

## 2023-02-09 NOTE — Anesthesia Procedure Notes (Signed)
Procedure Name: Intubation Date/Time: 02/09/2023 7:34 AM  Performed by: Marcene Duos, MDPre-anesthesia Checklist: Patient identified, Emergency Drugs available, Suction available and Patient being monitored Patient Re-evaluated:Patient Re-evaluated prior to induction Oxygen Delivery Method: Circle system utilized Preoxygenation: Pre-oxygenation with 100% oxygen Induction Type: IV induction Ventilation: Mask ventilation without difficulty Laryngoscope Size: Mac and 3 Grade View: Grade II Tube type: Oral Tube size: 7.5 mm Number of attempts: 1 Airway Equipment and Method: Stylet and Oral airway Placement Confirmation: ETT inserted through vocal cords under direct vision, positive ETCO2 and breath sounds checked- equal and bilateral Secured at: 22 cm Tube secured with: Tape Dental Injury: Teeth and Oropharynx as per pre-operative assessment

## 2023-02-09 NOTE — Transfer of Care (Signed)
Immediate Anesthesia Transfer of Care Note  Patient: Daniel Monroe  Procedure(s) Performed: Posterior Lumbar Interbody Fusion Lumbar five-Sacral one,  Re-Do Lumbar Four-Five Laminotomy (Spine Lumbar)  Patient Location: PACU  Anesthesia Type:General  Level of Consciousness: awake and alert   Airway & Oxygen Therapy: Patient Spontanous Breathing and Patient connected to face mask oxygen  Post-op Assessment: Report given to RN and Post -op Vital signs reviewed and stable  Post vital signs: Reviewed and stable  Last Vitals:  Vitals Value Taken Time  BP 159/97 02/09/23 1224  Temp    Pulse 107 02/09/23 1225  Resp 20 02/09/23 1225  SpO2 97 % 02/09/23 1225  Vitals shown include unfiled device data.  Last Pain:  Vitals:   02/09/23 0633  TempSrc:   PainSc: 3          Complications: No notable events documented.

## 2023-02-09 NOTE — H&P (Signed)
Subjective: The patient is a 67 year old white male who has had a previous lumbar discectomy.  He has been complaining of back and left greater right leg pain consistent with neurogenic claudication/lumbar radiculopathy.  He failed medical management and was worked up with lumbar x-rays and lumbar MRI which demonstrated an L5-S1 spondylolisthesis with foraminal stenosis.  I discussed the various treatment options with him.  He has decided proceed with surgery.  He has COPD and we have obtained pulmonary clearance.  Past Medical History:  Diagnosis Date   Arthritis    Back pain    Complete rotator cuff tear of left shoulder 07/25/2014   COPD (chronic obstructive pulmonary disease) (HCC)    on symbicort   Current smoker    1 ppd   GERD (gastroesophageal reflux disease)    History of hiatal hernia    pt states it was on CT results   Left rotator cuff tear    Rupture of biceps tendon    left   Shortness of breath    with exertion; pt says its due to smoking   URI (upper respiratory infection)     Past Surgical History:  Procedure Laterality Date   HERNIA REPAIR     as a child   LUMBAR LAMINECTOMY/DECOMPRESSION MICRODISCECTOMY Right 01/07/2013   Procedure: LUMBAR FOUR LUMBAR FIVE LAMINECTOMY/DECOMPRESSION MICRODISCECTOMY 1 LEVEL;  Surgeon: Cristi Loron, MD;  Location: [facility];  Service: Neurosurgery;  Laterality: Right;  RIGHT L45 microdiskectomy   REVERSE TOTAL SHOULDER ARTHROPLASTY Left 2019   ROTATOR CUFF REPAIR Left 2008   SHOULDER ARTHROSCOPY WITH ROTATOR CUFF REPAIR Left 07/25/2014   Procedure: LEFT SHOULDER ARTHROSCOPY WITH EXTENSIVE DEBRIDEMENT,  ROTATOR CUFF REPAIR;  Surgeon: Eulas Post, MD;  Location: [facility];  Service: Orthopedics;  Laterality: Left;   TOTAL SHOULDER ARTHROPLASTY Right 2008    Allergies  Allergen Reactions   Atorvastatin Other (See Comments)    Joint pain    Social History   Tobacco Use   Smoking status: Former     Average packs/day: 1 pack/day for 35.0 years (35.0 ttl pk-yrs)    Types: Cigarettes    Start date: 2017    Quit date: 1975    Years since quitting: 49.6   Smokeless tobacco: Never  Substance Use Topics   Alcohol use: Yes    Alcohol/week: 5.0 - 6.0 standard drinks of alcohol    Types: 5 - 6 Cans of beer per week    Comment: Daily    History reviewed. No pertinent family history. Prior to Admission medications   Medication Sig Start Date End Date Taking? Authorizing Provider  albuterol (VENTOLIN HFA) 108 (90 Base) MCG/ACT inhaler Inhale 2 puffs into the lungs every 4 (four) hours as needed for wheezing or shortness of breath. 07/11/16  Yes [provider]  Carboxymethylcellulose Sodium (THERATEARS) 0.25 % SOLN Place 1 drop into both eyes 4 (four) times daily as needed (Dry eyes).   Yes [provider]  cholecalciferol (VITAMIN D) 25 MCG (1000 UNIT) tablet Take 1,000 Units by mouth daily. 03/02/20  Yes [provider]  ezetimibe (ZETIA) 10 MG tablet Take 10 mg by mouth daily. 07/07/22  Yes [provider]  fexofenadine (ALLEGRA) 180 MG tablet Take 180 mg by mouth daily. 08/11/22  Yes [provider]  fluticasone (FLONASE) 50 MCG/ACT nasal spray Place 1 spray into both nostrils daily as needed for allergies or rhinitis.   Yes [provider]  furosemide (LASIX) 20 MG  tablet Take 30 mg by mouth 2 (two) times daily. 01/01/20  Yes [provider]  guaiFENesin (MUCINEX) 600 MG 12 hr tablet Take 1,200 mg by mouth 2 (two) times daily.   Yes [provider]  mometasone-formoterol (DULERA) 200-5 MCG/ACT AERO Inhale 2 puffs into the lungs 2 (two) times daily.   Yes [provider]  montelukast (SINGULAIR) 10 MG tablet Take 10 mg by mouth at bedtime. 11/08/22 08/12/23 Yes [provider]  nystatin (MYCOSTATIN) 100000 UNIT/ML suspension TAKE 6 ML BY MOUTH THREE TIMES A DAY FOR THRUSH 10/18/21 11/25/23 Yes [provider]  Omega-3 Fatty Acids (FISH OIL) 600 MG CAPS Take 1,800 mg by mouth daily. 600 mg   Yes [provider]  pantoprazole (PROTONIX) 40 MG tablet Take 40 mg by mouth 2 (two) times daily. 03/08/20  Yes [provider]  potassium chloride SA (KLOR-CON M) 20 MEQ tablet Take 20 mEq by mouth daily. 05/18/17 11/10/23 Yes [provider]  Tiotropium Bromide Monohydrate 2.5 MCG/ACT AERS Take 2 puffs by mouth daily. 05/09/22  Yes [provider]     Review of Systems  Positive ROS: As above  All other systems have been reviewed and were otherwise negative with the exception of those mentioned in the HPI and as above.  Objective: Vital signs in last 24 hours: Temp:  [98.3 F (36.8 C)] 98.3 F (36.8 C) (08/22 0603) Pulse Rate:  [86] 86 (08/22 0603) Resp:  [18] 18 (08/22 0603) BP: (131)/(83) 131/83 (08/22 0603) SpO2:  [95 %] 95 % (08/22 0603) Weight:  [102.1 kg] 102.1 kg (08/22 0603) Estimated body mass index is 33.23 kg/m as calculated from the following:   Height as of this encounter: 5\' 9"  (1.753 m).   Weight as of this encounter: 102.1 kg.   General Appearance: Alert Head: Normocephalic, without obvious abnormality, atraumatic Eyes: PERRL, conjunctiva/corneas clear, EOM's intact,    Ears: Normal  Throat: Normal  Neck: Supple, Back: Lumbar incisions well-healed. Lungs: Clear to auscultation bilaterally, respirations unlabored Heart: Regular rate and rhythm, no murmur, rub or gallop Abdomen: Soft, non-tender Extremities: Extremities normal, atraumatic, no cyanosis or edema Skin: unremarkable  NEUROLOGIC:   Mental status: alert and oriented,Motor Exam - grossly normal Sensory Exam - grossly normal Reflexes:  Coordination - grossly normal Gait - grossly normal Balance - grossly normal Cranial Nerves: I: smell Not tested  II: visual acuity  OS: Normal  OD: Normal   II: visual fields Full to confrontation  II: pupils Equal, round, reactive to  light  III,VII: ptosis None  III,IV,VI: extraocular muscles  Full ROM  V: mastication Normal  V: facial light touch sensation  Normal  V,VII: corneal reflex  Present  VII: facial muscle function - upper  Normal  VII: facial muscle function - lower Normal  VIII: hearing Not tested  IX: soft palate elevation  Normal  IX,X: gag reflex Present  XI: trapezius strength  5/5  XI: sternocleidomastoid strength 5/5  XI: neck flexion strength  5/5  XII: tongue strength  Normal    Data Review Lab Results  Component Value Date   WBC 6.2 01/30/2023   HGB 12.5 (L) 01/30/2023   HCT 37.3 (L) 01/30/2023   MCV 93.0 01/30/2023   PLT 287 01/30/2023   Lab Results  Component Value Date   NA 136 01/30/2023   K 3.9 01/30/2023   CL 95 (L) 01/30/2023   CO2 30 01/30/2023   BUN 5 (L) 01/30/2023   CREATININE 0.67  01/30/2023   GLUCOSE 165 (H) 01/30/2023   No results found for: "INR", "PROTIME"  Assessment/Plan: Lumbosacral spondylolisthesis, foraminal stenosis, radiculopathy, lumbago, neurogenic claudication: I have discussed the situation with the patient and his wife.  I reviewed his imaging studies with him and pointed out the abnormalities.  We have discussed the various treatment options including surgery.  I have described the surgical treatment option of an L5-S1 decompression, instrumentation and fusion.  I have shown him surgical models.  I have given him a surgical pamphlet.  We have discussed the risk, benefits, alternatives, expected postop course, and likelihood of achieving our goals with surgery.  I have answered all the patient's questions.  He has decided proceed with surgery.   Cristi Loron 02/09/2023 7:05 AM

## 2023-02-09 NOTE — Progress Notes (Signed)
Orthopedic Tech Progress Note Patient Details:  Daniel Monroe [birth year] [id number]  Ortho Devices Type of Ortho Device: Lumbar corsett Ortho Device/Splint Location: on pt Ortho Device/Splint Interventions: Ordered, Application, Adjustment   Post Interventions Patient Tolerated: Well Instructions Provided: Care of device, Adjustment of device  Daniel Monroe [name] 02/09/2023, 5:05 PM

## 2023-02-09 NOTE — Anesthesia Postprocedure Evaluation (Signed)
Anesthesia Post Note  Patient: Daniel Monroe  Procedure(s) Performed: Posterior Lumbar Interbody Fusion Lumbar five-Sacral one,  Re-Do Lumbar Four-Five Laminotomy (Spine Lumbar)     Patient location during evaluation: PACU Anesthesia Type: General Level of consciousness: awake and alert Pain management: pain level controlled Vital Signs Assessment: post-procedure vital signs reviewed and stable Respiratory status: spontaneous breathing, nonlabored ventilation, respiratory function stable and patient connected to nasal cannula oxygen Cardiovascular status: blood pressure returned to baseline and stable Postop Assessment: no apparent nausea or vomiting Anesthetic complications: no  No notable events documented.  Last Vitals:  Vitals:   02/09/23 1332 02/09/23 1543  BP: (!) 132/104 (!) 137/93  Pulse: 92 90  Resp: (!) 22 18  Temp: (!) 36.4 C 36.6 C  SpO2: 96% 98%    Last Pain:  Vitals:   02/09/23 1543  TempSrc: Oral  PainSc:                  Kennieth Rad

## 2023-02-09 NOTE — Op Note (Signed)
Brief history: The patient is a 67 year old white male whose had previous back surgeries.  He has developed recurrent and worsening back, buttock and leg pain consistent with a lumbosacral radiculopathy/neurogenic claudication.  He failed medical management and was worked up with lumbar x-rays and a lumbar myelo CT which demonstrated multilevel degenerative changes.  He had spinal stenosis and foraminal stenosis most prominent at L5-S1.  I discussed the various treatment options with him.  He has decided proceed with surgery.  Preoperative diagnosis: Lumbosacral spondylolisthesis, lumbosacral foraminal stenosis, lumbar degenerative disc disease, spinal stenosis compressing L4, L5 and S1 nerve roots; lumbago; lumbar radiculopathy; neurogenic claudication  Postoperative diagnosis: The same  Procedure: Lateral redo L4-5 and L5-S1 laminotomy/foraminotomies/medial facetectomy to decompress the bilateral L4, L5 and S1 nerve roots(the work required to do this was in addition to the work required to do the posterior lumbar interbody fusion because of the patient's spinal stenosis, facet arthropathy. Etc. requiring a wide decompression of the nerve roots.); L5-S1 transforaminal lumbar interbody fusion with local morselized autograft bone and Zimmer DBM; insertion of interbody prosthesis at L5-S1 (globus peek expandable interbody prosthesis); posterior nonsegmental instrumentation from L5-S1 with globus titanium pedicle screws and rods; posterior lateral arthrodesis at L5-S1 with local morselized autograft bone and Zimmer DBM.  Surgeon: Dr. Delma Officer  Asst.: Hildred Priest, NP  Anesthesia: Gen. endotracheal  Estimated blood loss: 250 cc  Drains: None  Complications: None  Description of procedure: The patient was brought to the operating room by the anesthesia team. General endotracheal anesthesia was induced. The patient was turned to the prone position on the Wilson frame. The patient's lumbosacral  region was then prepared with Betadine scrub and Betadine solution. Sterile drapes were applied.  I then injected the area to be incised with Marcaine with epinephrine solution. I then used the scalpel to make a linear midline incision over the L4-5 and L5-S1 interspace, incising through the old surgical scar. I then used electrocautery to perform a bilateral subperiosteal dissection exposing the remainder of the facets and lamina of L4-5 and L5-S1. We then obtained intraoperative radiograph to confirm our location. We then inserted the Verstrac retractor to provide exposure.  I began the decompression by using the high speed drill to perform laminotomies at L4-5 and L5-S1 bilaterally. We then used the Kerrison punches to widen the laminotomy and removed the epidural scar tissue and the remainder of the ligamentum flavum at 4 5 and L5-S1 bilaterally. We used the Kerrison punches to remove the medial facets at L4-5 and L5-S1 bilaterally. We performed wide foraminotomies about the bilateral L4, L5 and S1 nerve roots completing the decompression.  We now turned our attention to the posterior lumbar interbody fusion. I used a scalpel to incise the intervertebral disc at L5-S1 bilaterally. I then performed a partial intervertebral discectomy at L5-S1 bilaterally using the pituitary forceps. We prepared the vertebral endplates at L5-S1 bilaterally for the fusion by removing the soft tissues with the curettes. We then used the trial spacers to pick the appropriate sized interbody prosthesis. We prefilled his prosthesis with a combination of local morselized autograft bone that we obtained during the decompression as well as Zimmer DBM. We inserted the prefilled prosthesis into the interspace at L5-S1 from the left, we then turned and expanded the prosthesis. There was a good snug fit of the prosthesis in the interspace. We then filled and the remainder of the intervertebral disc space with local morselized autograft  bone and Zimmer DBM. This completed the posterior lumbar  interbody arthrodesis.  During the decompression and insertion of the prosthesis the assistant protected the thecal sac and nerve roots with the D'Errico retractor.  We now turned attention to the instrumentation. Under fluoroscopic guidance we cannulated the bilateral L5 and S1 pedicles with the bone probe. We then removed the bone probe. We then tapped the pedicle with a 6.5 millimeter tap. We then removed the tap. We probed inside the tapped pedicle with a ball probe to rule out cortical breaches. We then inserted a 6.5 x 55, 7.5 x 55 and 7.5 x 50 millimeter pedicle screw into the L5 and S1 pedicles bilaterally under fluoroscopic guidance. We then palpated along the medial aspect of the pedicles to rule out cortical breaches. There were none. The nerve roots were not injured. We then connected the unilateral pedicle screws with a lordotic rod. We compressed the construct and secured the rod in place with the caps. We then tightened the caps appropriately. This completed the instrumentation from L5-S1 bilaterally.  We now turned our attention to the posterior lateral arthrodesis at L5-S1. We used the high-speed drill to decorticate the remainder of the facets, pars, transverse process at L5-S1. We then applied a combination of local morselized autograft bone and Zimmer DBM over these decorticated posterior lateral structures. This completed the posterior lateral arthrodesis.  We then obtained hemostasis using bipolar electrocautery. We irrigated the wound out with bacitracin solution. We inspected the thecal sac and nerve roots and noted they were well decompressed. We then removed the retractor.  We injected Exparel . We reapproximated patient's thoracolumbar fascia with interrupted #1 Vicryl suture. We reapproximated patient's subcutaneous tissue with interrupted 2-0 Vicryl suture. The reapproximated patient's skin with Steri-Strips and benzoin. The  wound was then coated with bacitracin ointment. A sterile dressing was applied. The drapes were removed. The patient was subsequently returned to the supine position where they were extubated by the anesthesia team. He was then transported to the post anesthesia care unit in stable condition. All sponge instrument and needle counts were reportedly correct at the end of this case.

## 2023-02-10 DIAGNOSIS — M4317 Spondylolisthesis, lumbosacral region: Secondary | ICD-10-CM | POA: Diagnosis not present

## 2023-02-10 MED ORDER — DOCUSATE SODIUM 100 MG PO CAPS: 100.0000 mg | ORAL_CAPSULE | Freq: Two times a day (BID) | ORAL | 0 refills | Status: AC

## 2023-02-10 MED ORDER — CYCLOBENZAPRINE HCL 10 MG PO TABS: 10.0000 mg | ORAL_TABLET | Freq: Three times a day (TID) | ORAL | 0 refills | Status: AC | PRN

## 2023-02-10 MED ORDER — OXYCODONE-ACETAMINOPHEN 5-325 MG PO TABS: 1.0000 | ORAL_TABLET | ORAL | 0 refills | Status: AC | PRN

## 2023-02-10 MED FILL — Thrombin For Soln 5000 Unit: CUTANEOUS | Qty: 5000 | Status: AC

## 2023-02-10 NOTE — Progress Notes (Signed)
Subjective: The patient is alert and pleasant.  His wife is at the bedside.  Objective: Vital signs in last 24 hours: Temp:  [97.5 F (36.4 C)-98.1 F (36.7 C)] 97.8 F (36.6 C) (08/23 0324) Pulse Rate:  [87-108] 87 (08/23 0324) Resp:  [10-22] 20 (08/23 0324) BP: (119-159)/(68-104) 126/78 (08/23 0324) SpO2:  [93 %-99 %] 94 % (08/23 0324) Estimated body mass index is 33.23 kg/m as calculated from the following:   Height as of this encounter: 5\' 9"  (1.753 m).   Weight as of this encounter: 102.1 kg.   Intake/Output from previous day: 08/22 0701 - 08/23 0700 In: 1490 [P.O.:240; I.V.:1000; IV Piggyback:250] Out: 925 [Urine:625; Blood:300] Intake/Output this shift: No intake/output data recorded.  Physical exam the patient is alert and pleasant.  His strength is normal.  His honeycomb dressing is rolling off.  Lab Results: No results for input(s): "WBC", "HGB", "HCT", "PLT" in the last 72 hours. BMET No results for input(s): "NA", "K", "CL", "CO2", "GLUCOSE", "BUN", "CREATININE", "CALCIUM" in the last 72 hours.  Studies/Results: DG Lumbar Spine 2-3 Views  Result Date: 02/09/2023 CLINICAL DATA:  Elective surgery EXAM: LUMBAR SPINE - 2-3 VIEW COMPARISON:  None Available. FINDINGS: Two fluoroscopic spot views of the lumbar spine obtained in the operating room. Pedicle screws at L5 and S1 with interbody spacer in place. Fluoroscopy time 41 seconds. Dose 40.921 mGy IMPRESSION: Intraoperative fluoroscopy during L5-S1 fusion. Electronically Signed   By: Narda Rutherford M.D.   On: 02/09/2023 13:36   DG Lumbar Spine 2-3 Views  Result Date: 02/09/2023 CLINICAL DATA:  Elective surgery.  Localization images. EXAM: LUMBAR SPINE - 2-3 VIEW COMPARISON:  Preoperative lumbar CT demonstrating 5 non-rib-bearing lumbar vertebra. FINDINGS: Two portable cross-table lateral views of the lumbar spine obtained in the operating room. Image 1 demonstrates surgical instruments posteriorly at the L5 level.  Image 2 demonstrates surgical instruments at the posterior aspect of the L5-S1 disc space. IMPRESSION: Intraoperative localization during lumbar spine surgery. Electronically Signed   By: Narda Rutherford M.D.   On: 02/09/2023 13:35   DG C-Arm 1-60 Min-No Report  Result Date: 02/09/2023 Fluoroscopy was utilized by the requesting physician.  No radiographic interpretation.   DG C-Arm 1-60 Min-No Report  Result Date: 02/09/2023 Fluoroscopy was utilized by the requesting physician.  No radiographic interpretation.    Assessment/Plan: Postop day #1: The patient is doing well.  He may go home later on today after he works with PT.  I gave him his discharge instructions and answered all his questions.  LOS: 0 days     Cristi Loron 02/10/2023, 6:53 AM     Patient ID: Daniel Monroe, male   DOB: 1955/11/20, 67 y.o.   MRN: [id number]

## 2023-02-10 NOTE — Progress Notes (Signed)
Patient alert and oriented, void, ambulate.d/c instructions explain and given. Surgical site clean and dry no sign of infection. Patient d/c home per order.

## 2023-02-10 NOTE — Evaluation (Signed)
Physical Therapy Evaluation and Discharge Patient Details Name: Daniel Monroe MRN: [id number] DOB: [birth year] Today's Date: 02/10/2023  History of Present Illness  67 yo male s/p 8/22 PLIF L5-s1 revision L4-5 PMH arthritis L RTS COPD smoker, hernia repair, back surgery, R TSA.  Clinical Impression  Patient is s/p above surgery resulting in functional limitations due to the deficits listed below (see PT Problem List). Patient evaluated by Physical Therapy with no further acute PT needs identified. All education has been completed and the patient has no further questions. Ambulating >200 feet at supervision level with a RW, 2L supplemental O2 (baseline.) No signs of buckling, light RW support and good control. Reviewed precautions, energy conservation techniques, and brace use. Declines stair training; can enter/exit home without using stairs. All questions answered, eager to d/c home. Wife support available as needed. Adequate for d/c from PT standpoint. PT is signing off. Thank you for this referral.        If plan is discharge home, recommend the following: A little help with bathing/dressing/bathroom;Assistance with cooking/housework;Assist for transportation;Help with stairs or ramp for entrance   Can travel by private vehicle     yes    Equipment Recommendations None recommended by PT     Functional Status Assessment Patient has had a recent decline in their functional status and demonstrates the ability to make significant improvements in function in a reasonable and predictable amount of time.     Precautions / Restrictions Precautions Precautions: Back Precaution Comments: back precautions reviewed and educated for adls (Sitting 45 min) Required Braces or Orthoses: Spinal Brace Spinal Brace: Lumbar corset;Applied in sitting position Restrictions Weight Bearing Restrictions: No      Mobility  Bed Mobility               General bed mobility comments: sitting EOB, declines  need for further training.    Transfers Overall transfer level: Modified independent                 General transfer comment: Safe set-up with RW. No assist for transfer    Ambulation/Gait Ambulation/Gait assistance: Supervision Gait Distance (Feet): 225 Feet Assistive device: Rolling walker (2 wheels) Gait Pattern/deviations: Step-through pattern, Decreased stride length, Trunk flexed Gait velocity: decr Gait velocity interpretation: <1.8 ft/sec, indicate of risk for recurrent falls   General Gait Details: Educated on safe AD use with RW. Demonstrates good control and placement. Cues for upright posture, energy conservation, symptom awareness, and progression. BIL hip pain reported towards end of distance before getting back to room but able to safely complete without signs of buckling or LOB while using RW for light support.  Stairs Stairs:  (Declines)          Wheelchair Mobility     Tilt Bed    Modified Rankin (Stroke Patients Only)       Balance Overall balance assessment: Mild deficits observed, not formally tested                                           Pertinent Vitals/Pain Pain Assessment Pain Assessment: Faces Faces Pain Scale: Hurts even more Pain Location: back and bil hips towards end of session Pain Descriptors / Indicators: Aching Pain Intervention(s): Monitored during session, Repositioned, Patient requesting pain meds-RN notified, Limited activity within patient's tolerance    Home Living Family/patient expects to be discharged to:: Private residence Living Arrangements:  Spouse/significant other Available Help at Discharge: Family Type of Home: House Home Access: Stairs to enter Entrance Stairs-Rails: Left (has a w/c ramp if needed to get in from garage) Entrance Stairs-Number of Steps: 5   Home Layout: One level Home Equipment: Rollator (4 wheels);Grab bars - tub/shower;Shower seat;Electric scooter (scooter x2/  electric bed for control function of the frame) Additional Comments: has wife (A)    Prior Function Prior Level of Function : Independent/Modified Independent;Driving                     Extremity/Trunk Assessment   Upper Extremity Assessment Upper Extremity Assessment: Defer to OT evaluation    Lower Extremity Assessment Lower Extremity Assessment: Overall WFL for tasks assessed LLE Deficits / Details: reports resolved numbness in leg that was present prior. Guarded. Functional assessment able to WB without buckling.    Cervical / Trunk Assessment Cervical / Trunk Assessment: Back Surgery  Communication   Communication Communication: No apparent difficulties  Cognition Arousal: Alert Behavior During Therapy: WFL for tasks assessed/performed Overall Cognitive Status: Within Functional Limits for tasks assessed                                          General Comments General comments (skin integrity, edema, etc.): Amb on 2L supplemental O2, at rest 2L baseline.    Exercises     Assessment/Plan    PT Assessment Patient does not need any further PT services  PT Problem List         PT Treatment Interventions      PT Goals (Current goals can be found in the Care Plan section)  Acute Rehab PT Goals Patient Stated Goal: Go home asap PT Goal Formulation: All assessment and education complete, DC therapy    Frequency       Co-evaluation               AM-PAC PT "6 Clicks" Mobility  Outcome Measure Help needed turning from your back to your side while in a flat bed without using bedrails?: None Help needed moving from lying on your back to sitting on the side of a flat bed without using bedrails?: None Help needed moving to and from a bed to a chair (including a wheelchair)?: None Help needed standing up from a chair using your arms (e.g., wheelchair or bedside chair)?: None Help needed to walk in hospital room?: A Little Help needed  climbing 3-5 steps with a railing? : A Little 6 Click Score: 22    End of Session Equipment Utilized During Treatment: Gait belt;Back brace;Oxygen Activity Tolerance: Patient tolerated treatment well Patient left: in bed;with call bell/phone within reach;with family/visitor present Nurse Communication: Mobility status PT Visit Diagnosis: Other abnormalities of gait and mobility (R26.89);Difficulty in walking, not elsewhere classified (R26.2);Pain;Other symptoms and signs involving the nervous system (R29.898) Pain - part of body:  (back)    Time: 8119-1478 PT Time Calculation (min) (ACUTE ONLY): 14 min   Charges:   PT Evaluation $PT Eval Low Complexity: 1 Low   PT General Charges $$ ACUTE PT VISIT: 1 Visit         Kathlyn Sacramento, PT, DPT [facility]  Rehabilitation Services Physical Therapist Office: [phone] Website: [email or web address]   Berton Mount 02/10/2023, 11:08 AM

## 2023-02-10 NOTE — Plan of Care (Signed)

## 2023-02-10 NOTE — Discharge Instructions (Signed)

## 2023-02-10 NOTE — Evaluation (Addendum)
Occupational Therapy Evaluation Patient Details Name: Daniel Monroe MRN: [id number] DOB: 1955-08-24 Today's Date: 02/10/2023   History of Present Illness 67 yo male s/p 8/22 PLIF L5-s1 revision L4-5 PMH arthritis L RTS COPD smoker, hernia repair, back surgery, R TSA   Clinical Impression   Patient evaluated by Occupational Therapy with no further acute OT needs identified. All education has been completed and the patient has no further questions. See below for any follow-up Occupational Therapy or equipment needs. OT to sign off. Thank you for referral.     *mentioned energy conservation to patient. OT giving handout to PT prior to session to share with patient.     If plan is discharge home, recommend the following: A little help with walking and/or transfers    Functional Status Assessment  Patient has had a recent decline in their functional status and demonstrates the ability to make significant improvements in function in a reasonable and predictable amount of time.  Equipment Recommendations  None recommended by OT    Recommendations for Other Services       Precautions / Restrictions Precautions Precautions: Back Precaution Comments: back precautions reviewed and educated for adls Required Braces or Orthoses: Spinal Brace Spinal Brace: Lumbar corset;Applied in sitting position      Mobility Bed Mobility Overal bed mobility: Modified Independent                  Transfers Overall transfer level: Modified independent                 General transfer comment: cues for hand placement      Balance Overall balance assessment: Mild deficits observed, not formally tested                                         ADL either performed or assessed with clinical judgement   ADL Overall ADL's : Needs assistance/impaired Eating/Feeding: Independent               Upper Body Dressing : Set up;Sitting   Lower Body Dressing: Set up;Cueing  for back precautions;Adhering to back precautions;Sit to/from stand;With adaptive equipment Lower Body Dressing Details (indicate cue type and reason): advised on use of AE to prevent bending. pt was able to do come figure 4 positioning with facial grimace. pt advised not to pull or cause discomfort but what is within personal range. pt educated on AE to avoid bending Toilet Transfer: Supervision/safety;Regular Toilet           Functional mobility during ADLs: Supervision/safety;Rolling walker (2 wheels)  Back handout provided and reviewed adls in detail. Pt educated on: clothing between brace, never sleep in brace, set an alarm at night for medication, avoid sitting for long periods of time, correct bed positioning for sleeping, correct sequence for bed mobility, avoiding lifting more than 5 pounds and never wash directly over incision. All education is complete and patient indicates understanding.      Vision Baseline Vision/History: 1 Wears glasses Ability to See in Adequate Light: 0 Adequate Vision Assessment?: No apparent visual deficits     Perception         Praxis         Pertinent Vitals/Pain Pain Assessment Pain Assessment: No/denies pain     Extremity/Trunk Assessment Upper Extremity Assessment Upper Extremity Assessment: Overall WFL for tasks assessed (able to complete full shoulder flexion with full elow  extension bil)   Lower Extremity Assessment Lower Extremity Assessment: LLE deficits/detail LLE Deficits / Details: reports resolved numbness in leg that was present prior   Cervical / Trunk Assessment Cervical / Trunk Assessment: Back Surgery   Communication Communication Communication: No apparent difficulties   Cognition Arousal: Alert Behavior During Therapy: WFL for tasks assessed/performed Overall Cognitive Status: Within Functional Limits for tasks assessed                                       General Comments  dressing intact and  dry at this time    Exercises     Shoulder Instructions      Home Living Family/patient expects to be discharged to:: Private residence Living Arrangements: Spouse/significant other Available Help at Discharge: Family Type of Home: House Home Access: Stairs to enter Secretary/administrator of Steps: 5 Entrance Stairs-Rails: Left (has a w/c ramp if needed to get in from garage) Home Layout: One level     Bathroom Shower/Tub: Producer, television/film/video: Handicapped height     Home Equipment: Rollator (4 wheels);Grab bars - tub/shower;Shower seat;Electric scooter (scooter x2/ electric bed for control function of the frame)   Additional Comments: has wife (A)      Prior Functioning/Environment Prior Level of Function : Independent/Modified Independent;Driving                        OT Problem List:        OT Treatment/Interventions:      OT Goals(Current goals can be found in the care plan section) Acute Rehab OT Goals Patient Stated Goal: to leave today. wife is attending funeral today at 2pm  OT Frequency:      Co-evaluation              AM-PAC OT "6 Clicks" Daily Activity     Outcome Measure Help from another person eating meals?: None Help from another person taking care of personal grooming?: None Help from another person toileting, which includes using toliet, bedpan, or urinal?: None Help from another person bathing (including washing, rinsing, drying)?: None Help from another person to put on and taking off regular upper body clothing?: None Help from another person to put on and taking off regular lower body clothing?: A Little 6 Click Score: 23   End of Session Equipment Utilized During Treatment: Gait belt;Rolling walker (2 wheels);Back brace Nurse Communication: Mobility status;Precautions  Activity Tolerance: Patient tolerated treatment well Patient left: in bed;with call bell/phone within reach;with family/visitor present  OT  Visit Diagnosis: Unsteadiness on feet (R26.81)                Time: 2694-8546 OT Time Calculation (min): 24 min Charges:  OT General Charges $OT Visit: 1 Visit OT Evaluation $OT Eval Moderate Complexity: 1 Mod   Brynn, OTR/L  Acute Rehabilitation Services Office: [phone] .   Mateo Flow 02/10/2023, 10:47 AM

## 2023-02-10 NOTE — Discharge Summary (Signed)
Physician Discharge Summary     Providing Compassionate, Quality Care - Together   Patient ID: Daniel Monroe MRN: [id number] DOB/AGE: 1956-03-13 67 y.o.  Admit date: 02/09/2023 Discharge date: 02/10/2023  Admission Diagnoses: Spondylolisthesis of lumbar spine  Discharge Diagnoses:  Principal Problem:   Spondylolisthesis of lumbosacral region   Discharged Condition: good  Hospital Course: Patient underwent an L5-S1 TLIF by Dr. Lovell Sheehan on 02/09/2023. He was admitted to 3C02 following recovery from anesthesia in the PACU. His postoperative course has been uncomplicated. He has worked with both physical and occupational therapies who feel the patient is ready for discharge home. He is ambulating independently and without difficulty. He is tolerating a normal diet. He is not having any bowel or bladder dysfunction. His pain is well-controlled with oral pain medication. He is ready for discharge home.   Consults: PT/OT/TOC  Significant Diagnostic Studies: radiology: DG Lumbar Spine 2-3 Views  Result Date: 02/09/2023 CLINICAL DATA:  Elective surgery EXAM: LUMBAR SPINE - 2-3 VIEW COMPARISON:  None Available. FINDINGS: Two fluoroscopic spot views of the lumbar spine obtained in the operating room. Pedicle screws at L5 and S1 with interbody spacer in place. Fluoroscopy time 41 seconds. Dose 40.921 mGy IMPRESSION: Intraoperative fluoroscopy during L5-S1 fusion. Electronically Signed   By: Narda Rutherford M.D.   On: 02/09/2023 13:36   DG Lumbar Spine 2-3 Views  Result Date: 02/09/2023 CLINICAL DATA:  Elective surgery.  Localization images. EXAM: LUMBAR SPINE - 2-3 VIEW COMPARISON:  Preoperative lumbar CT demonstrating 5 non-rib-bearing lumbar vertebra. FINDINGS: Two portable cross-table lateral views of the lumbar spine obtained in the operating room. Image 1 demonstrates surgical instruments posteriorly at the L5 level. Image 2 demonstrates surgical instruments at the posterior aspect of the L5-S1  disc space. IMPRESSION: Intraoperative localization during lumbar spine surgery. Electronically Signed   By: Narda Rutherford M.D.   On: 02/09/2023 13:35   DG C-Arm 1-60 Min-No Report  Result Date: 02/09/2023 Fluoroscopy was utilized by the requesting physician.  No radiographic interpretation.   DG C-Arm 1-60 Min-No Report  Result Date: 02/09/2023 Fluoroscopy was utilized by the requesting physician.  No radiographic interpretation.     Treatments: surgery: Lateral redo L4-5 and L5-S1 laminotomy/foraminotomies/medial facetectomy to decompress the bilateral L4, L5 and S1 nerve roots(the work required to do this was in addition to the work required to do the posterior lumbar interbody fusion because of the patient's spinal stenosis, facet arthropathy. Etc. requiring a wide decompression of the nerve roots.); L5-S1 transforaminal lumbar interbody fusion with local morselized autograft bone and Zimmer DBM; insertion of interbody prosthesis at L5-S1 (globus peek expandable interbody prosthesis); posterior nonsegmental instrumentation from L5-S1 with globus titanium pedicle screws and rods; posterior lateral arthrodesis at L5-S1 with local morselized autograft bone and Zimmer DBM.   Discharge Exam: Blood pressure 132/72, pulse 84, temperature 98.3 F (36.8 C), temperature source Oral, resp. rate 19, height 5\' 9"  (1.753 m), weight 102.1 kg, SpO2 96%.  Per report: Alert and oriented x 4 PERRLA CN II-XII grossly intact MAE, Strength and sensation intact Incision is covered with Honeycomb dressing and Steri Strips; Dressing is clean and dry   Disposition: Discharge disposition: 01-Home or Self Care       Discharge Instructions     Call MD for:  difficulty breathing, headache or visual disturbances   Complete by: As directed    Call MD for:  hives   Complete by: As directed    Call MD for:  persistant nausea and vomiting  Complete by: As directed    Call MD for:  redness, tenderness,  or signs of infection (pain, swelling, redness, odor or green/yellow discharge around incision site)   Complete by: As directed    Call MD for:  severe uncontrolled pain   Complete by: As directed    Diet - low sodium heart healthy   Complete by: As directed    If the dressing is still on your incision site when you go home, remove it on the third day after your surgery date. Remove dressing if it begins to fall off, or if it is dirty or damaged before the third day.   Complete by: As directed    Increase activity slowly   Complete by: As directed       Allergies as of 02/10/2023       Reactions   Atorvastatin Other (See Comments)   Joint pain        Medication List     TAKE these medications    albuterol 108 (90 Base) MCG/ACT inhaler Commonly known as: VENTOLIN HFA Inhale 2 puffs into the lungs every 4 (four) hours as needed for wheezing or shortness of breath.   cholecalciferol 25 MCG (1000 UNIT) tablet Commonly known as: VITAMIN D3 Take 1,000 Units by mouth daily.   cyclobenzaprine 10 MG tablet Commonly known as: FLEXERIL Take 1 tablet (10 mg total) by mouth 3 (three) times daily as needed for muscle spasms.   docusate sodium 100 MG capsule Commonly known as: COLACE Take 1 capsule (100 mg total) by mouth 2 (two) times daily.   Dulera 200-5 MCG/ACT Aero Generic drug: mometasone-formoterol Inhale 2 puffs into the lungs 2 (two) times daily.   ezetimibe 10 MG tablet Commonly known as: ZETIA Take 10 mg by mouth daily.   fexofenadine 180 MG tablet Commonly known as: ALLEGRA Take 180 mg by mouth daily.   Fish Oil 600 MG Caps Take 1,800 mg by mouth daily. 600 mg   fluticasone 50 MCG/ACT nasal spray Commonly known as: FLONASE Place 1 spray into both nostrils daily as needed for allergies or rhinitis.   furosemide 20 MG tablet Commonly known as: LASIX Take 30 mg by mouth 2 (two) times daily.   guaiFENesin 600 MG 12 hr tablet Commonly known as: MUCINEX Take  1,200 mg by mouth 2 (two) times daily.   montelukast 10 MG tablet Commonly known as: SINGULAIR Take 10 mg by mouth at bedtime.   nystatin 100000 UNIT/ML suspension Commonly known as: MYCOSTATIN TAKE 6 ML BY MOUTH THREE TIMES A DAY FOR THRUSH   oxyCODONE-acetaminophen 5-325 MG tablet Commonly known as: Percocet Take 1-2 tablets by mouth every 4 (four) hours as needed for severe pain.   pantoprazole 40 MG tablet Commonly known as: PROTONIX Take 40 mg by mouth 2 (two) times daily.   potassium chloride SA 20 MEQ tablet Commonly known as: KLOR-CON M Take 20 mEq by mouth daily.   Theratears 0.25 % Soln Generic drug: Carboxymethylcellulose Sodium Place 1 drop into both eyes 4 (four) times daily as needed (Dry eyes).   Tiotropium Bromide Monohydrate 2.5 MCG/ACT Aers Take 2 puffs by mouth daily.               Discharge Care Instructions  (From admission, onward)           Start     Ordered   02/10/23 0000  If the dressing is still on your incision site when you go home, remove it on the  third day after your surgery date. Remove dressing if it begins to fall off, or if it is dirty or damaged before the third day.        02/10/23 [id number]            Follow-up Information     Tressie Stalker, MD. Go on 03/03/2023.   Specialty: Neurosurgery Why: First post op appointment with x-rays is on 03/03/2023 at 8:30 AM Contact information: [address] [place] [address] [phone]                 Signed: Val Eagle, DNP, AGNP-C Nurse Practitioner  [facility] [address], [place], [place] [address] P: [phone]    F: [phone]  02/10/2023, 10:00 AM

## 2023-02-15 MED FILL — Sodium Chloride IV Soln 0.9%: INTRAVENOUS | Qty: 1000 | Status: AC

## 2023-02-15 MED FILL — Heparin Sodium (Porcine) Inj 1000 Unit/ML: INTRAMUSCULAR | Qty: 30 | Status: AC

## 2023-02-16 ENCOUNTER — Encounter (HOSPITAL_COMMUNITY): Payer: Self-pay | Admitting: Neurosurgery

## 2023-11-21 ENCOUNTER — Other Ambulatory Visit (HOSPITAL_BASED_OUTPATIENT_CLINIC_OR_DEPARTMENT_OTHER): Payer: Self-pay | Admitting: Neurosurgery

## 2023-11-21 DIAGNOSIS — M4317 Spondylolisthesis, lumbosacral region: Secondary | ICD-10-CM

## 2023-11-26 ENCOUNTER — Ambulatory Visit (HOSPITAL_BASED_OUTPATIENT_CLINIC_OR_DEPARTMENT_OTHER)
Admission: RE | Admit: 2023-11-26 | Discharge: 2023-11-26 | Disposition: A | Discharge status: Home / Self Care | Source: Ambulatory Visit | Attending: Neurosurgery | Admitting: Neurosurgery

## 2023-11-26 DIAGNOSIS — M4317 Spondylolisthesis, lumbosacral region: Secondary | ICD-10-CM | POA: Insufficient documentation

## 2023-11-26 MED ORDER — GADOBUTROL 1 MMOL/ML IV SOLN
10.0000 mL | Freq: Once | INTRAVENOUS | Status: AC | PRN
  Administered 2023-11-26: 10 mL via INTRAVENOUS
